# Patient Record
Sex: Female | Born: 1965 | Race: Black or African American | Hispanic: No | Marital: Married | Smoking: Never smoker
Health system: Southern US, Community
[De-identification: ages and names within clinical notes are randomized; demographics above are authoritative.]

## PROBLEM LIST (undated history)

## (undated) DIAGNOSIS — M1990 Unspecified osteoarthritis, unspecified site: Secondary | ICD-10-CM

## (undated) DIAGNOSIS — N289 Disorder of kidney and ureter, unspecified: Secondary | ICD-10-CM

## (undated) DIAGNOSIS — K219 Gastro-esophageal reflux disease without esophagitis: Secondary | ICD-10-CM

## (undated) DIAGNOSIS — E669 Obesity, unspecified: Secondary | ICD-10-CM

## (undated) DIAGNOSIS — I10 Essential (primary) hypertension: Secondary | ICD-10-CM

## (undated) DIAGNOSIS — L0292 Furuncle, unspecified: Secondary | ICD-10-CM

## (undated) DIAGNOSIS — M25569 Pain in unspecified knee: Secondary | ICD-10-CM

## (undated) DIAGNOSIS — M109 Gout, unspecified: Secondary | ICD-10-CM

## (undated) DIAGNOSIS — D219 Benign neoplasm of connective and other soft tissue, unspecified: Secondary | ICD-10-CM

## (undated) HISTORY — PX: TUBAL LIGATION: SHX77

## (undated) HISTORY — PX: CHOLECYSTECTOMY: SHX55

---

## 2004-05-24 ENCOUNTER — Emergency Department (HOSPITAL_COMMUNITY): Admission: EM | Admit: 2004-05-24 | Discharge: 2004-05-24 | Payer: Self-pay | Admitting: Emergency Medicine

## 2004-08-08 ENCOUNTER — Ambulatory Visit: Payer: Self-pay | Admitting: Internal Medicine

## 2004-08-09 ENCOUNTER — Encounter (INDEPENDENT_AMBULATORY_CARE_PROVIDER_SITE_OTHER): Payer: Self-pay | Admitting: Specialist

## 2004-08-09 ENCOUNTER — Ambulatory Visit: Payer: Self-pay | Admitting: Obstetrics and Gynecology

## 2004-08-14 ENCOUNTER — Ambulatory Visit (HOSPITAL_COMMUNITY): Admission: RE | Admit: 2004-08-14 | Discharge: 2004-08-14 | Payer: Self-pay | Admitting: *Deleted

## 2005-01-16 ENCOUNTER — Ambulatory Visit: Payer: Self-pay | Admitting: Internal Medicine

## 2005-01-16 ENCOUNTER — Ambulatory Visit (HOSPITAL_COMMUNITY): Admission: RE | Admit: 2005-01-16 | Discharge: 2005-01-16 | Payer: Self-pay | Admitting: Internal Medicine

## 2005-01-17 ENCOUNTER — Encounter (INDEPENDENT_AMBULATORY_CARE_PROVIDER_SITE_OTHER): Payer: Self-pay | Admitting: Internal Medicine

## 2005-01-17 ENCOUNTER — Ambulatory Visit: Payer: Self-pay | Admitting: Internal Medicine

## 2005-01-24 ENCOUNTER — Ambulatory Visit: Payer: Self-pay | Admitting: Internal Medicine

## 2005-06-25 ENCOUNTER — Ambulatory Visit: Payer: Self-pay | Admitting: Internal Medicine

## 2005-07-09 ENCOUNTER — Ambulatory Visit: Payer: Self-pay | Admitting: Hospitalist

## 2005-09-13 ENCOUNTER — Ambulatory Visit: Payer: Self-pay | Admitting: Internal Medicine

## 2005-10-09 ENCOUNTER — Emergency Department (HOSPITAL_COMMUNITY): Admission: EM | Admit: 2005-10-09 | Discharge: 2005-10-09 | Payer: Self-pay | Admitting: Emergency Medicine

## 2005-11-05 ENCOUNTER — Emergency Department (HOSPITAL_COMMUNITY): Admission: EM | Admit: 2005-11-05 | Discharge: 2005-11-05 | Payer: Self-pay | Admitting: Emergency Medicine

## 2006-06-26 ENCOUNTER — Emergency Department (HOSPITAL_COMMUNITY): Admission: EM | Admit: 2006-06-26 | Discharge: 2006-06-26 | Payer: Self-pay | Admitting: Emergency Medicine

## 2006-07-03 ENCOUNTER — Encounter (INDEPENDENT_AMBULATORY_CARE_PROVIDER_SITE_OTHER): Payer: Self-pay | Admitting: Internal Medicine

## 2006-07-03 DIAGNOSIS — K219 Gastro-esophageal reflux disease without esophagitis: Secondary | ICD-10-CM | POA: Insufficient documentation

## 2006-07-03 DIAGNOSIS — E669 Obesity, unspecified: Secondary | ICD-10-CM

## 2006-07-03 DIAGNOSIS — M069 Rheumatoid arthritis, unspecified: Secondary | ICD-10-CM | POA: Insufficient documentation

## 2006-07-04 DIAGNOSIS — M1990 Unspecified osteoarthritis, unspecified site: Secondary | ICD-10-CM | POA: Insufficient documentation

## 2006-09-01 ENCOUNTER — Telehealth: Payer: Self-pay | Admitting: *Deleted

## 2006-09-02 ENCOUNTER — Emergency Department (HOSPITAL_COMMUNITY): Admission: EM | Admit: 2006-09-02 | Discharge: 2006-09-02 | Payer: Self-pay | Admitting: Emergency Medicine

## 2006-12-04 ENCOUNTER — Emergency Department (HOSPITAL_COMMUNITY): Admission: EM | Admit: 2006-12-04 | Discharge: 2006-12-05 | Payer: Self-pay | Admitting: Emergency Medicine

## 2006-12-10 ENCOUNTER — Encounter (INDEPENDENT_AMBULATORY_CARE_PROVIDER_SITE_OTHER): Payer: Self-pay | Admitting: Internal Medicine

## 2006-12-10 ENCOUNTER — Ambulatory Visit: Payer: Self-pay | Admitting: Infectious Disease

## 2006-12-10 DIAGNOSIS — I10 Essential (primary) hypertension: Secondary | ICD-10-CM | POA: Insufficient documentation

## 2006-12-10 DIAGNOSIS — F17200 Nicotine dependence, unspecified, uncomplicated: Secondary | ICD-10-CM

## 2006-12-10 DIAGNOSIS — K589 Irritable bowel syndrome without diarrhea: Secondary | ICD-10-CM

## 2006-12-10 LAB — CONVERTED CEMR LAB
Basophils Absolute: 0 10*3/uL (ref 0.0–0.1)
Eosinophils Relative: 2 % (ref 0–5)
HCT: 39.7 % (ref 36.0–46.0)
Hgb A1c MFr Bld: 4.9 %
Lymphs Abs: 3.1 10*3/uL (ref 0.7–3.3)
Monocytes Absolute: 0.8 10*3/uL — ABNORMAL HIGH (ref 0.2–0.7)
RBC: 4.55 M/uL (ref 3.87–5.11)
RDW: 13.6 % (ref 11.5–14.0)
TSH: 2.038 microintl units/mL (ref 0.350–5.50)
WBC: 12.6 10*3/uL — ABNORMAL HIGH (ref 4.0–10.5)

## 2006-12-11 ENCOUNTER — Ambulatory Visit: Payer: Self-pay | Admitting: Infectious Disease

## 2006-12-11 ENCOUNTER — Encounter (INDEPENDENT_AMBULATORY_CARE_PROVIDER_SITE_OTHER): Payer: Self-pay | Admitting: Internal Medicine

## 2006-12-11 LAB — CONVERTED CEMR LAB
ALT: 14 units/L (ref 0–35)
AST: 12 units/L (ref 0–37)
Alkaline Phosphatase: 94 units/L (ref 39–117)
BUN: 10 mg/dL (ref 6–23)
CO2: 25 meq/L (ref 19–32)
Calcium: 9.1 mg/dL (ref 8.4–10.5)
Chloride: 103 meq/L (ref 96–112)
Glucose, Bld: 82 mg/dL (ref 70–99)
LDL Cholesterol: 87 mg/dL (ref 0–99)
Total Bilirubin: 0.5 mg/dL (ref 0.3–1.2)

## 2006-12-15 ENCOUNTER — Ambulatory Visit: Payer: Self-pay | Admitting: Internal Medicine

## 2006-12-25 ENCOUNTER — Emergency Department (HOSPITAL_COMMUNITY): Admission: EM | Admit: 2006-12-25 | Discharge: 2006-12-25 | Payer: Self-pay | Admitting: Emergency Medicine

## 2006-12-26 ENCOUNTER — Ambulatory Visit: Payer: Self-pay | Admitting: Internal Medicine

## 2007-02-09 ENCOUNTER — Emergency Department (HOSPITAL_COMMUNITY): Admission: EM | Admit: 2007-02-09 | Discharge: 2007-02-09 | Payer: Self-pay | Admitting: Emergency Medicine

## 2007-02-16 ENCOUNTER — Telehealth: Payer: Self-pay | Admitting: *Deleted

## 2007-02-19 ENCOUNTER — Ambulatory Visit (HOSPITAL_COMMUNITY): Admission: RE | Admit: 2007-02-19 | Discharge: 2007-02-19 | Payer: Self-pay | Admitting: Internal Medicine

## 2007-02-19 ENCOUNTER — Ambulatory Visit: Payer: Self-pay | Admitting: Internal Medicine

## 2007-03-23 ENCOUNTER — Encounter (INDEPENDENT_AMBULATORY_CARE_PROVIDER_SITE_OTHER): Payer: Self-pay | Admitting: *Deleted

## 2007-03-23 ENCOUNTER — Encounter (INDEPENDENT_AMBULATORY_CARE_PROVIDER_SITE_OTHER): Payer: Self-pay | Admitting: Internal Medicine

## 2007-03-23 ENCOUNTER — Ambulatory Visit: Payer: Self-pay | Admitting: *Deleted

## 2007-03-23 ENCOUNTER — Ambulatory Visit (HOSPITAL_COMMUNITY): Admission: RE | Admit: 2007-03-23 | Discharge: 2007-03-23 | Payer: Self-pay | Admitting: Internal Medicine

## 2007-03-23 ENCOUNTER — Ambulatory Visit: Payer: Self-pay | Admitting: Internal Medicine

## 2007-03-24 ENCOUNTER — Encounter (INDEPENDENT_AMBULATORY_CARE_PROVIDER_SITE_OTHER): Payer: Self-pay | Admitting: Internal Medicine

## 2007-04-07 ENCOUNTER — Telehealth: Payer: Self-pay | Admitting: *Deleted

## 2007-04-20 ENCOUNTER — Telehealth: Payer: Self-pay | Admitting: *Deleted

## 2007-04-29 ENCOUNTER — Telehealth (INDEPENDENT_AMBULATORY_CARE_PROVIDER_SITE_OTHER): Payer: Self-pay | Admitting: Internal Medicine

## 2007-05-15 ENCOUNTER — Telehealth: Payer: Self-pay | Admitting: *Deleted

## 2007-06-11 ENCOUNTER — Encounter (INDEPENDENT_AMBULATORY_CARE_PROVIDER_SITE_OTHER): Payer: Self-pay | Admitting: Internal Medicine

## 2007-06-11 ENCOUNTER — Ambulatory Visit: Payer: Self-pay | Admitting: Internal Medicine

## 2007-06-11 ENCOUNTER — Encounter: Payer: Self-pay | Admitting: Internal Medicine

## 2007-06-16 LAB — CONVERTED CEMR LAB
Anti Nuclear Antibody(ANA): NEGATIVE
BUN: 12 mg/dL (ref 6–23)
Calcium: 9.8 mg/dL (ref 8.4–10.5)
Rhuematoid fact SerPl-aCnc: <20 intl units/mL (ref 0–20)
Sed Rate: 15 mm/hr (ref 0–22)

## 2007-07-29 ENCOUNTER — Encounter (INDEPENDENT_AMBULATORY_CARE_PROVIDER_SITE_OTHER): Payer: Self-pay | Admitting: Internal Medicine

## 2007-07-29 ENCOUNTER — Ambulatory Visit (HOSPITAL_COMMUNITY): Admission: RE | Admit: 2007-07-29 | Discharge: 2007-07-29 | Payer: Self-pay | Admitting: Hospitalist

## 2007-07-29 ENCOUNTER — Ambulatory Visit: Payer: Self-pay | Admitting: Hospitalist

## 2007-07-29 LAB — CONVERTED CEMR LAB
ALT: 19 units/L (ref 0–35)
Albumin: 4.3 g/dL (ref 3.5–5.2)
Alkaline Phosphatase: 93 units/L (ref 39–117)
Basophils Absolute: 0 10*3/uL (ref 0.0–0.1)
Beta hcg, urine, semiquantitative: NEGATIVE
Blood in Urine, dipstick: NEGATIVE
CO2: 26 meq/L (ref 19–32)
Calcium: 9.7 mg/dL (ref 8.4–10.5)
Chloride: 104 meq/L (ref 96–112)
Eosinophils Absolute: 0.4 10*3/uL (ref 0.0–0.7)
Glucose, Bld: 99 mg/dL (ref 70–99)
Glucose, Urine, Semiquant: NEGATIVE
Hgb A1c MFr Bld: 5.1 %
Ketones, urine, test strip: NEGATIVE
Lipase: 15 units/L (ref 0–75)
Lymphocytes Relative: 28 % (ref 12–46)
Monocytes Absolute: 0.9 10*3/uL (ref 0.1–1.0)
Monocytes Relative: 8 % (ref 3–12)
Neutro Abs: 7.3 10*3/uL (ref 1.7–7.7)
Platelets: 300 10*3/uL (ref 150–400)
Potassium: 4 meq/L (ref 3.5–5.3)
Protein, U semiquant: 100
RDW: 13.6 % (ref 11.5–15.5)
Sodium: 141 meq/L (ref 135–145)
Total Bilirubin: 0.2 mg/dL — ABNORMAL LOW (ref 0.3–1.2)
WBC Urine, dipstick: NEGATIVE
WBC: 12 10*3/uL — ABNORMAL HIGH (ref 4.0–10.5)
pH: 5

## 2007-07-30 ENCOUNTER — Encounter (INDEPENDENT_AMBULATORY_CARE_PROVIDER_SITE_OTHER): Payer: Self-pay | Admitting: Internal Medicine

## 2007-10-13 ENCOUNTER — Encounter (INDEPENDENT_AMBULATORY_CARE_PROVIDER_SITE_OTHER): Payer: Self-pay | Admitting: Internal Medicine

## 2007-10-13 ENCOUNTER — Telehealth: Payer: Self-pay | Admitting: Internal Medicine

## 2007-10-13 ENCOUNTER — Encounter (INDEPENDENT_AMBULATORY_CARE_PROVIDER_SITE_OTHER): Payer: Self-pay | Admitting: *Deleted

## 2007-10-13 ENCOUNTER — Ambulatory Visit: Payer: Self-pay | Admitting: Internal Medicine

## 2007-10-13 DIAGNOSIS — N920 Excessive and frequent menstruation with regular cycle: Secondary | ICD-10-CM

## 2007-10-13 DIAGNOSIS — M549 Dorsalgia, unspecified: Secondary | ICD-10-CM | POA: Insufficient documentation

## 2007-10-13 LAB — CONVERTED CEMR LAB
Hemoglobin, Urine: NEGATIVE
Ketones, ur: NEGATIVE mg/dL
Leukocytes, UA: NEGATIVE
Protein, ur: NEGATIVE mg/dL
pH: 6 (ref 5.0–8.0)

## 2007-10-20 ENCOUNTER — Encounter: Payer: Self-pay | Admitting: Internal Medicine

## 2007-10-20 ENCOUNTER — Ambulatory Visit: Payer: Self-pay | Admitting: Internal Medicine

## 2007-10-20 LAB — CONVERTED CEMR LAB
ALT: 14 units/L (ref 0–35)
AST: 12 units/L (ref 0–37)
Albumin: 4 g/dL (ref 3.5–5.2)
Basophils Relative: 0 % (ref 0–1)
Chloride: 101 meq/L (ref 96–112)
Creatinine, Ser: 0.62 mg/dL (ref 0.40–1.20)
Cyclic Citrullin Peptide Ab: 1.3 units (ref <7)
Eosinophils Absolute: 0.3 10*3/uL (ref 0.0–0.7)
Glucose, Bld: 85 mg/dL (ref 70–99)
HCT: 38.6 % (ref 36.0–46.0)
Lymphocytes Relative: 28 % (ref 12–46)
Lymphs Abs: 2.4 10*3/uL (ref 0.7–4.0)
MCV: 91.9 fL (ref 78.0–100.0)
Monocytes Absolute: 0.5 10*3/uL (ref 0.1–1.0)
Neutro Abs: 5.3 10*3/uL (ref 1.7–7.7)
Neutrophils Relative %: 62 % (ref 43–77)
Platelets: 285 10*3/uL (ref 150–400)
Potassium: 4.4 meq/L (ref 3.5–5.3)
RBC: 4.2 M/uL (ref 3.87–5.11)
RDW: 14.4 % (ref 11.5–15.5)
Sodium: 136 meq/L (ref 135–145)
Total Bilirubin: 0.4 mg/dL (ref 0.3–1.2)
Total Protein: 7.1 g/dL (ref 6.0–8.3)
WBC: 8.5 10*3/uL (ref 4.0–10.5)

## 2007-11-13 ENCOUNTER — Ambulatory Visit: Payer: Self-pay | Admitting: *Deleted

## 2007-11-13 ENCOUNTER — Encounter (INDEPENDENT_AMBULATORY_CARE_PROVIDER_SITE_OTHER): Payer: Self-pay | Admitting: Internal Medicine

## 2007-11-16 LAB — CONVERTED CEMR LAB
Amphetamine Screen, Ur: NEGATIVE
BUN: 12 mg/dL (ref 6–23)
Benzodiazepines.: NEGATIVE
CO2: 25 meq/L (ref 19–32)
Calcium: 9.4 mg/dL (ref 8.4–10.5)
Chloride: 100 meq/L (ref 96–112)
Cocaine Metabolites: NEGATIVE
Creatinine,U: 106.9 mg/dL
Glucose, Bld: 87 mg/dL (ref 70–99)
Marijuana Metabolite: NEGATIVE
Opiates: NEGATIVE
Sodium: 138 meq/L (ref 135–145)

## 2008-02-12 ENCOUNTER — Telehealth (INDEPENDENT_AMBULATORY_CARE_PROVIDER_SITE_OTHER): Payer: Self-pay | Admitting: Internal Medicine

## 2008-05-09 ENCOUNTER — Encounter (INDEPENDENT_AMBULATORY_CARE_PROVIDER_SITE_OTHER): Payer: Self-pay | Admitting: Internal Medicine

## 2008-05-09 ENCOUNTER — Ambulatory Visit: Payer: Self-pay | Admitting: Internal Medicine

## 2008-05-09 ENCOUNTER — Encounter: Payer: Self-pay | Admitting: Internal Medicine

## 2008-05-09 LAB — CONVERTED CEMR LAB
ALT: 14 units/L (ref 0–35)
Albumin: 3.9 g/dL (ref 3.5–5.2)
Chloride: 100 meq/L (ref 96–112)
Creatinine, Ser: 0.69 mg/dL (ref 0.40–1.20)
Sodium: 137 meq/L (ref 135–145)
Total Bilirubin: 0.3 mg/dL (ref 0.3–1.2)

## 2008-06-14 ENCOUNTER — Ambulatory Visit: Payer: Self-pay | Admitting: Internal Medicine

## 2008-06-14 ENCOUNTER — Encounter (INDEPENDENT_AMBULATORY_CARE_PROVIDER_SITE_OTHER): Payer: Self-pay | Admitting: Internal Medicine

## 2008-06-14 LAB — CONVERTED CEMR LAB
Basophils Relative: 0 % (ref 0–1)
CO2: 25 meq/L (ref 19–32)
Creatinine, Ser: 0.66 mg/dL (ref 0.40–1.20)
Glucose, Bld: 83 mg/dL (ref 70–99)
Ketones, ur: NEGATIVE mg/dL
Lipase: 17 units/L (ref 0–75)
Lymphocytes Relative: 27 % (ref 12–46)
MCHC: 32 g/dL (ref 30.0–36.0)
MCV: 89.5 fL (ref 78.0–100.0)
Neutrophils Relative %: 65 % (ref 43–77)
Platelets: 311 10*3/uL (ref 150–400)
Potassium: 3.8 meq/L (ref 3.5–5.3)
Protein, ur: NEGATIVE mg/dL
RDW: 14.2 % (ref 11.5–15.5)
Sed Rate: 20 mm/hr (ref 0–22)
Total Protein: 7.4 g/dL (ref 6.0–8.3)
Urine Glucose: NEGATIVE mg/dL
Urobilinogen, UA: 0.2 (ref 0.0–1.0)
WBC: 11.1 10*3/uL — ABNORMAL HIGH (ref 4.0–10.5)
pH: 6.5 (ref 5.0–8.0)

## 2008-06-15 ENCOUNTER — Ambulatory Visit (HOSPITAL_COMMUNITY): Admission: RE | Admit: 2008-06-15 | Discharge: 2008-06-15 | Payer: Self-pay | Admitting: Internal Medicine

## 2008-06-15 ENCOUNTER — Ambulatory Visit: Payer: Self-pay | Admitting: Internal Medicine

## 2008-06-15 ENCOUNTER — Encounter (INDEPENDENT_AMBULATORY_CARE_PROVIDER_SITE_OTHER): Payer: Self-pay | Admitting: Internal Medicine

## 2008-06-15 DIAGNOSIS — K7689 Other specified diseases of liver: Secondary | ICD-10-CM

## 2008-07-04 ENCOUNTER — Telehealth: Payer: Self-pay | Admitting: Internal Medicine

## 2008-07-14 ENCOUNTER — Telehealth (INDEPENDENT_AMBULATORY_CARE_PROVIDER_SITE_OTHER): Payer: Self-pay | Admitting: Internal Medicine

## 2008-07-28 ENCOUNTER — Telehealth (INDEPENDENT_AMBULATORY_CARE_PROVIDER_SITE_OTHER): Payer: Self-pay | Admitting: *Deleted

## 2008-08-08 ENCOUNTER — Ambulatory Visit: Payer: Self-pay | Admitting: Internal Medicine

## 2008-08-08 ENCOUNTER — Encounter (INDEPENDENT_AMBULATORY_CARE_PROVIDER_SITE_OTHER): Payer: Self-pay | Admitting: Internal Medicine

## 2008-08-27 ENCOUNTER — Emergency Department (HOSPITAL_COMMUNITY): Admission: EM | Admit: 2008-08-27 | Discharge: 2008-08-27 | Payer: Self-pay | Admitting: Emergency Medicine

## 2008-12-09 ENCOUNTER — Ambulatory Visit: Payer: Self-pay | Admitting: Internal Medicine

## 2008-12-09 ENCOUNTER — Ambulatory Visit (HOSPITAL_COMMUNITY): Admission: RE | Admit: 2008-12-09 | Discharge: 2008-12-09 | Payer: Self-pay | Admitting: Internal Medicine

## 2008-12-09 DIAGNOSIS — N946 Dysmenorrhea, unspecified: Secondary | ICD-10-CM | POA: Insufficient documentation

## 2008-12-09 LAB — CONVERTED CEMR LAB
CO2: 28 meq/L (ref 19–32)
Chloride: 101 meq/L (ref 96–112)
Potassium: 3.8 meq/L (ref 3.5–5.3)

## 2008-12-27 ENCOUNTER — Telehealth: Payer: Self-pay | Admitting: Internal Medicine

## 2009-03-29 ENCOUNTER — Telehealth (INDEPENDENT_AMBULATORY_CARE_PROVIDER_SITE_OTHER): Payer: Self-pay | Admitting: *Deleted

## 2009-03-31 ENCOUNTER — Emergency Department (HOSPITAL_COMMUNITY): Admission: EM | Admit: 2009-03-31 | Discharge: 2009-04-01 | Payer: Self-pay | Admitting: Emergency Medicine

## 2009-05-01 ENCOUNTER — Telehealth: Payer: Self-pay | Admitting: *Deleted

## 2009-05-06 IMAGING — CR DG ABDOMEN 2V
5 series · 5 of 5 positions shown · non-contrast
Comparison: None

CLINICAL DATA: Right lower quadrant pain for 2 weeks

ABDOMEN - 2 VIEW

[w abdomen upright * (1 of 2)]
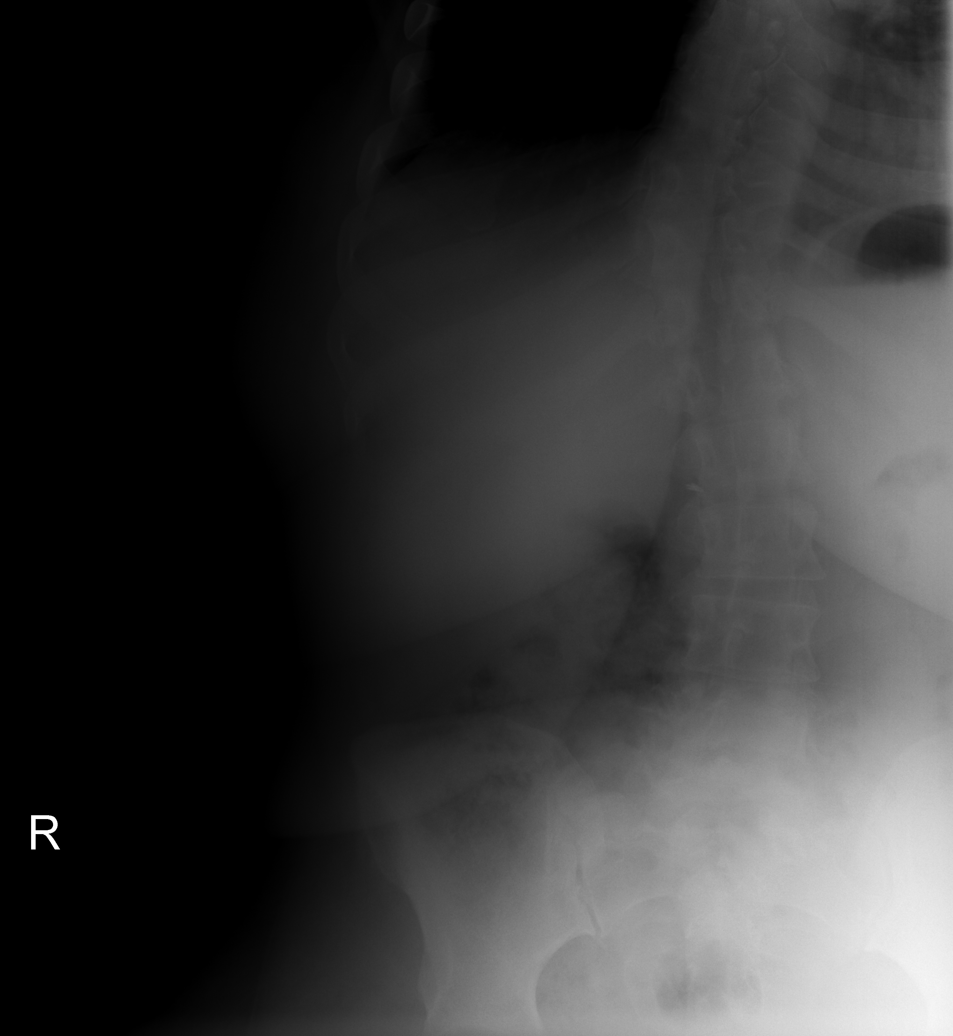

[w abdomen upright * (2 of 2)]
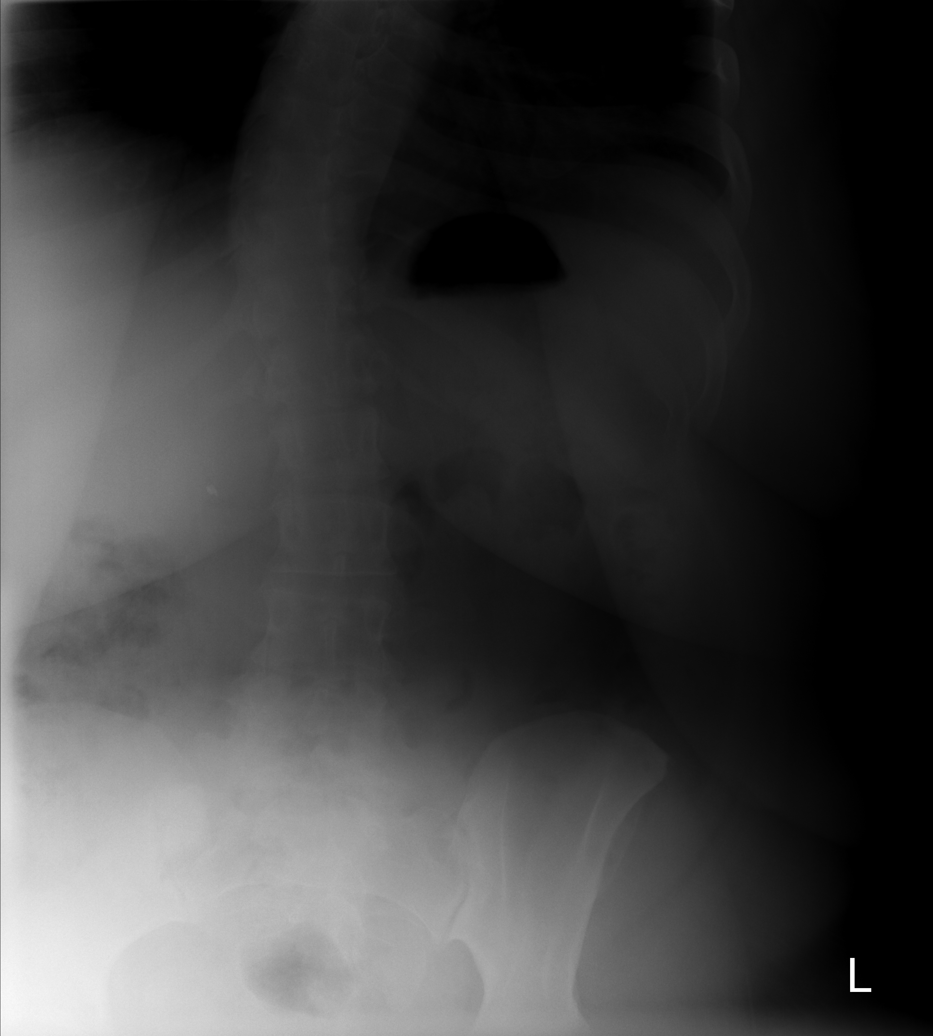

[t abdomen supine * (1 of 3)]
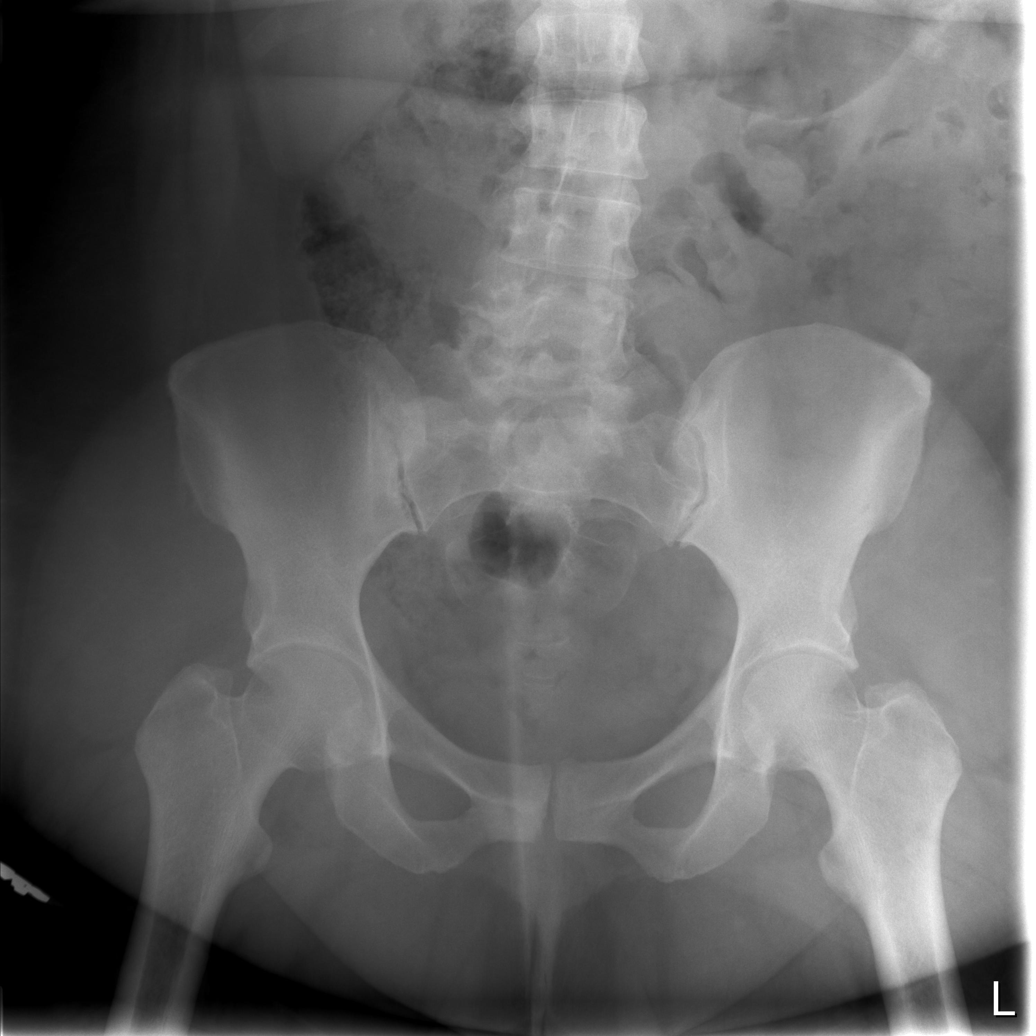

[t abdomen supine * (2 of 3)]
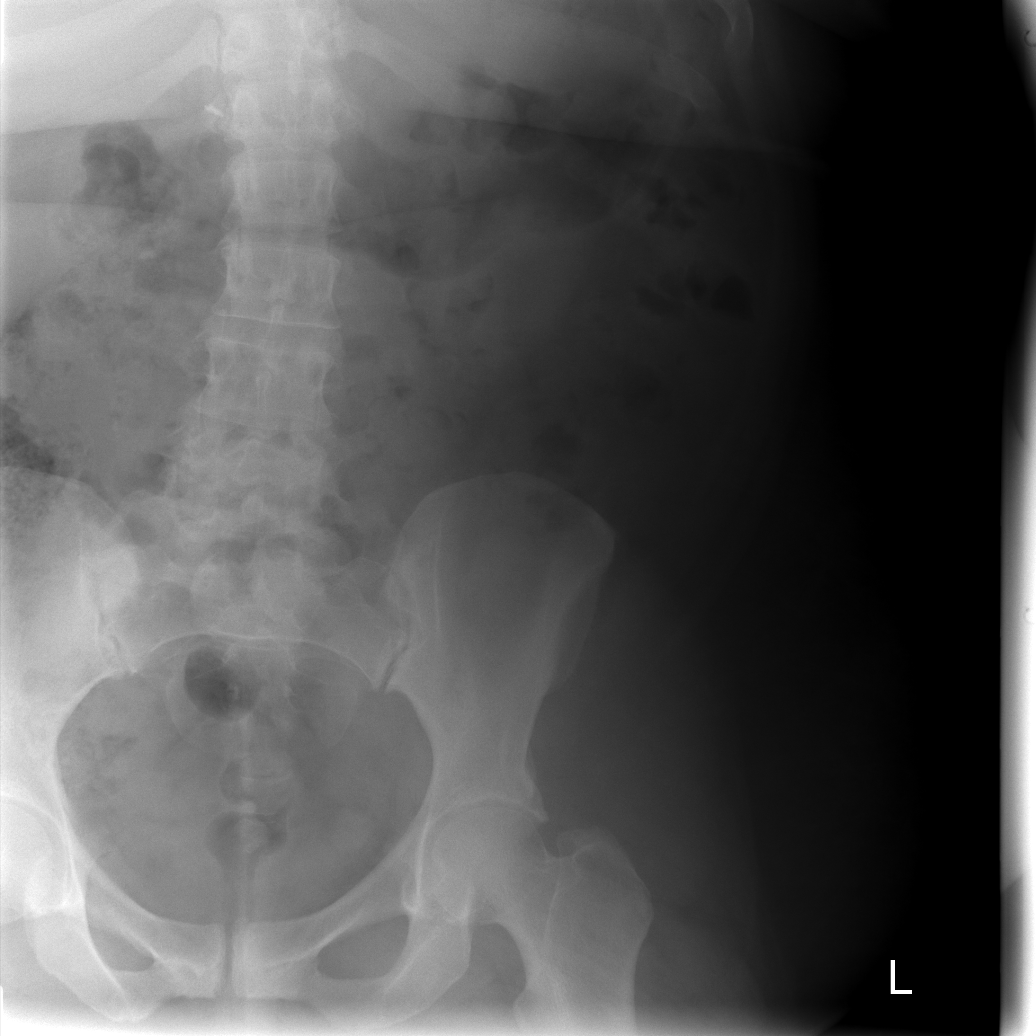

[t abdomen supine * (3 of 3)]
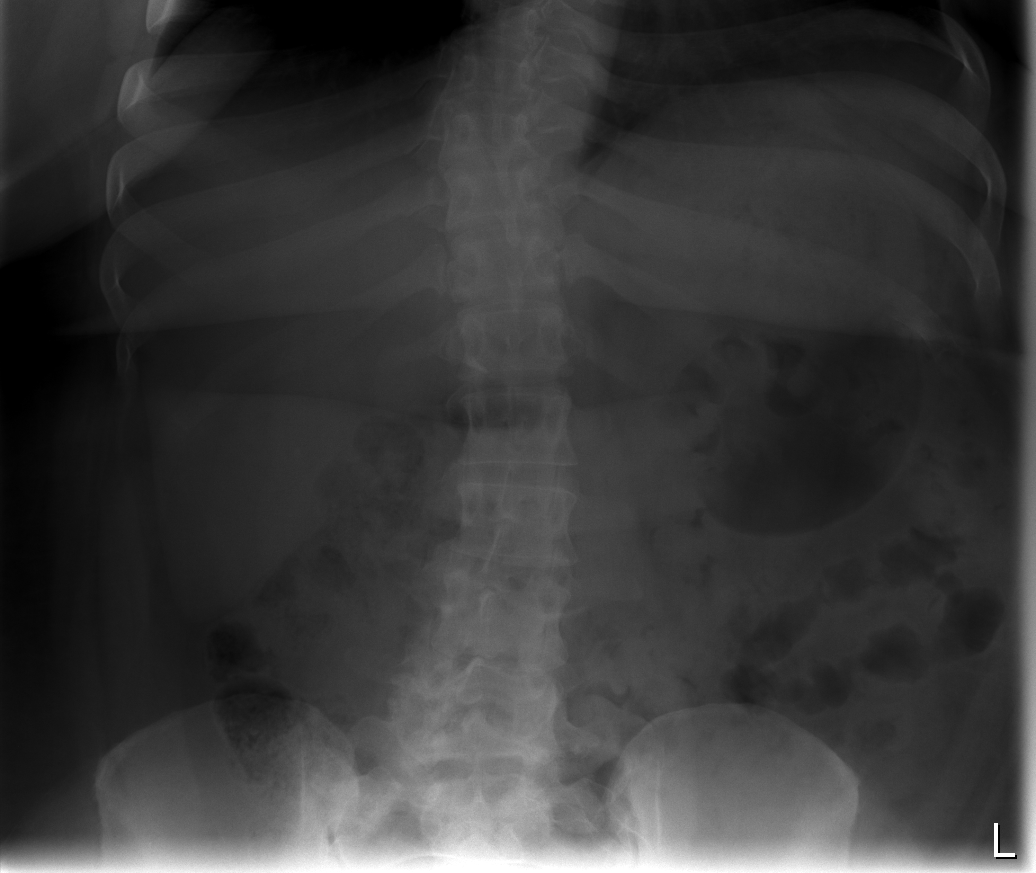

[5 of 5 positions shown; findings below may reference images not displayed]

FINDINGS: No bowel obstruction is seen and no free air is noted.
No opaque calculi are seen.
IMPRESSION: Nonspecific bowel gas pattern.  Probable degenerative change
involving the SI joints right greater than left.

## 2009-06-05 ENCOUNTER — Encounter (INDEPENDENT_AMBULATORY_CARE_PROVIDER_SITE_OTHER): Payer: Self-pay | Admitting: Internal Medicine

## 2009-06-05 ENCOUNTER — Telehealth: Payer: Self-pay | Admitting: *Deleted

## 2009-06-05 ENCOUNTER — Ambulatory Visit: Payer: Self-pay | Admitting: Internal Medicine

## 2009-06-05 DIAGNOSIS — M25561 Pain in right knee: Secondary | ICD-10-CM

## 2009-06-05 DIAGNOSIS — M25562 Pain in left knee: Secondary | ICD-10-CM

## 2009-06-05 LAB — CONVERTED CEMR LAB
BUN: 14 mg/dL (ref 6–23)
Chloride: 99 meq/L (ref 96–112)
Creatinine, Ser: 0.69 mg/dL (ref 0.40–1.20)
Glucose, Bld: 76 mg/dL (ref 70–99)
Potassium: 4.3 meq/L (ref 3.5–5.3)

## 2009-06-12 ENCOUNTER — Telehealth: Payer: Self-pay | Admitting: Internal Medicine

## 2009-07-03 ENCOUNTER — Encounter: Admission: RE | Admit: 2009-07-03 | Discharge: 2009-08-07 | Payer: Self-pay | Admitting: Internal Medicine

## 2009-07-19 ENCOUNTER — Ambulatory Visit: Payer: Self-pay | Admitting: Internal Medicine

## 2009-07-19 ENCOUNTER — Encounter (INDEPENDENT_AMBULATORY_CARE_PROVIDER_SITE_OTHER): Payer: Self-pay | Admitting: Internal Medicine

## 2009-07-28 ENCOUNTER — Telehealth: Payer: Self-pay | Admitting: Internal Medicine

## 2009-08-16 ENCOUNTER — Encounter: Payer: Self-pay | Admitting: Family Medicine

## 2009-08-16 ENCOUNTER — Ambulatory Visit: Payer: Self-pay | Admitting: Sports Medicine

## 2009-08-16 DIAGNOSIS — R269 Unspecified abnormalities of gait and mobility: Secondary | ICD-10-CM | POA: Insufficient documentation

## 2009-11-06 ENCOUNTER — Telehealth: Payer: Self-pay | Admitting: *Deleted

## 2009-11-06 ENCOUNTER — Telehealth: Payer: Self-pay | Admitting: Internal Medicine

## 2010-01-23 ENCOUNTER — Telehealth: Payer: Self-pay | Admitting: *Deleted

## 2010-03-30 ENCOUNTER — Ambulatory Visit: Payer: Self-pay | Admitting: Internal Medicine

## 2010-04-16 ENCOUNTER — Encounter: Admission: RE | Admit: 2010-04-16 | Payer: Self-pay | Source: Home / Self Care | Admitting: Internal Medicine

## 2010-04-18 ENCOUNTER — Ambulatory Visit: Payer: Self-pay | Admitting: Sports Medicine

## 2010-05-03 ENCOUNTER — Ambulatory Visit: Admit: 2010-05-03 | Payer: Self-pay

## 2010-05-09 ENCOUNTER — Ambulatory Visit: Admit: 2010-05-09 | Payer: Self-pay | Admitting: Sports Medicine

## 2010-05-09 ENCOUNTER — Ambulatory Visit: Admission: RE | Admit: 2010-05-09 | Discharge: 2010-05-09 | Payer: Self-pay | Source: Home / Self Care

## 2010-05-14 ENCOUNTER — Ambulatory Visit
Admission: RE | Admit: 2010-05-14 | Discharge: 2010-05-14 | Payer: Self-pay | Source: Home / Self Care | Attending: Family Medicine | Admitting: Family Medicine

## 2010-05-21 ENCOUNTER — Ambulatory Visit: Admit: 2010-05-21 | Payer: Self-pay | Admitting: Family Medicine

## 2010-05-23 ENCOUNTER — Encounter: Admission: RE | Admit: 2010-05-23 | Payer: Self-pay | Source: Home / Self Care | Admitting: Internal Medicine

## 2010-05-29 NOTE — Progress Notes (Signed)
  Phone Note Call from Patient   Summary of Call: Patient called that she has been having dry cough, myalgias, sore throat for the last couple of days. Denies chest pain, shortness of breath, palpitations, diaphoresis.  Patient was instructed to treat symptomatically with over the counter cough supressants as it is most likely viral upper respiratory tract infection but if symptoms worsened to go to the Emergency Room or Urgent care center for further evaluation. Patient was agreable to go to the ED or urgent care center if needed.

## 2010-05-29 NOTE — Assessment & Plan Note (Signed)
Summary: NP,DJD RT KNEE,PROBABLY NEEDS INJECTIONS,MC   Primary Provider:  Marinda Elk MD   History of Present Illness: Longstanding history of medial right knee pain of insidious onset  ~4 months ago. Pain worst on prolonged ambulation and deep flexion. Pain relived on rest and position change. No history of right knee injury or procedures. No knee popping/locking/catching. Occasoinally right knee buckling sensation which occurs w/pain. No falls. No back/hip/ankle/foot pain. No paresthesias.  Allergies: 1)  ! Iodine  Past History:  Currently unemployed PMH-FH-SH reviewed for relevance  Physical Exam  General:  Well-developed,well-nourished,in no acute distress; alert,appropriate and cooperative throughout examination. Obese. Msk:  HIPS: * Limitation partly attributable to habitus. Mild general limitation wrt ROM given habitus. Some right abduction weakness (4/5).  KNEES: * Limitation partly attributable to habitus. Mod genu valgum. Mild ttp over right pes anserine bursa. No ligamentous instability. Patient guarding on McMurray's. Normal nv examination.  AKLES/FEET/GAIT: Pes planus. Excessive pronation. FROM. Full strength. Findings partly controlled on comforthotic insertion.    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 Component of pes bursitis 2/2 abnl gait resulting from knee pain and ankle/foot deformity. Meniscal exam limited by patient guarding.  - D/C aleve. - Start mobic. - [employer] rx for right pat stabilizer brace for use only during ambulatory activities. - Comforthotics for ankle/foot deformities which place medial stress on knee during ambulation. - Start formal physical therapy. - RTC in 4-6 weeks.  Her updated medication list for this problem includes:    Tramadol Hcl 50 Mg Tabs (Tramadol hcl) .Marland Kitchen... Take 1-2 tabs every 4 hours as needed for pain.    Vicodin 5-500 Mg Tabs  (Hydrocodone-acetaminophen) .Marland Kitchen... Take 1 tab by mouth every 4 hrs as needed for pain.  Orders: Home Health Referral (Home Health)  Problem # 2:  OTHER ACQUIRED DEFORMITY OF ANKLE AND FOOT OTHER (ICD-736.79)  - Per item #1.  Orders: Home Health Referral (Home Health)  Problem # 3:  ABNORMALITY OF GAIT (ICD-781.2)  - Per item #2.  Complete Medication List: 1)  Zestoretic 20-25 Mg Tabs (Lisinopril-hydrochlorothiazide) .... Take 1 tablet by mouth daily. 2)  Dicyclomine Hcl 20 Mg Tabs (Dicyclomine hcl) .... Take 1 tablet by mouth four times a day for seven days 3)  Tramadol Hcl 50 Mg Tabs (Tramadol hcl) .... Take 1-2 tabs every 4 hours as needed for pain. 4)  Vicodin 5-500 Mg Tabs (Hydrocodone-acetaminophen) .... Take 1 tab by mouth every 4 hrs as needed for pain.

## 2010-05-29 NOTE — Progress Notes (Signed)
Summary: Refill/gh  Phone Note Refill Request Message from:  Fax from Pharmacy on May 01, 2009 4:39 PM  Refills Requested: Medication #1:  ZESTORETIC 20-25 MG TABS take 1 tablet by mouth daily.   Last Refilled: 11/26/2008  Method Requested: Electronic Initial call taken by: Angelina Ok RN,  May 01, 2009 4:39 PM  Follow-up for Phone Call        Refill approved-nurse to complete Follow-up by: Julaine Fusi  DO,  May 02, 2009 9:59 AM    Prescriptions: ZESTORETIC 20-25 MG TABS (LISINOPRIL-HYDROCHLOROTHIAZIDE) take 1 tablet by mouth daily.  #31 x 11   Entered and Authorized by:   Julaine Fusi  DO   Signed by:   Julaine Fusi  DO on 05/02/2009   Method used:   Electronically to        [facility] [phone]* (retail)       [address]       [place], [place]  [address]       Ph: [phone]       Fax: [phone]   RxID:   [id number]

## 2010-05-29 NOTE — Miscellaneous (Signed)
Summary: Audio-Visual Observation & Taping  Audio-Visual Observation & Taping   Imported By: Florinda Marker 06/09/2009 15:44:31  _____________________________________________________________________  External Attachment:    Type:   Image     Comment:   External Document

## 2010-05-29 NOTE — Letter (Signed)
Summary: [facility] re[facility] referral form  [facility] referral form   Imported By: Marily Memos 08/21/2009 11:16:05  _____________________________________________________________________  External Attachment:    Type:   Image     Comment:   External Document

## 2010-05-29 NOTE — Progress Notes (Signed)
Summary: phone/gg  Phone Note Call from Patient   Caller: Patient Summary of Call: Pt called with c/o knee swelling and pain.  She can't afford patch and cream that was ordered.  She request pain med. She is unable to sleep because of the pain.  Pt seen in clinic on 2/7 Pt # [phone] [facility] called and they do not carry voltaren gel or flector patch.  Pt request more pain med as DICLOFENAC is not helping.  Pt very angry when I talked to her about goint to [facility] to get her meds.  She said she would go to ED if I didn't want to help her.  Pt then hung up the phone.   Initial call taken by: Merrie Roof RN,  June 12, 2009 10:29 AM  Follow-up for Phone Call        Aware of phone-call. Will call in Tramadol for patient.   Additional Follow-up for Phone Call Additional follow up Details #1::        Pt called back and shse went to [facility] and they don't carry the patches or gel.  What is she to do? She wants pain meds called into [facility] on [address]. Additional Follow-up by: Merrie Roof RN,  June 12, 2009 2:04 PM    New/Updated Medications: TRAMADOL HCL 50 MG TABS (TRAMADOL HCL) Take 1-2 tabs every 4 hours as needed for pain. Prescriptions: TRAMADOL HCL 50 MG TABS (TRAMADOL HCL) Take 1-2 tabs every 4 hours as needed for pain.  #120 x 2   Entered and Authorized by:   Melida Quitter MD   Signed by:   Melida Quitter MD on 06/12/2009   Method used:   Telephoned to ...       [facility] [address] [phone]* (retail)       [address]       [place], [place]  [address]       Ph: [phone]       Fax: [phone]   RxID:   [id number]   Appended Document: phone/gg called into [facility]

## 2010-05-29 NOTE — Assessment & Plan Note (Signed)
Summary: CHECKUP/SB.   Vital Signs:  Patient profile:   45 year old female Height:      61 inches Weight:      340.1 pounds BMI:     64.49 Temp:     98.5 degrees F oral Pulse rate:   96 / minute BP sitting:   100 / 60  (left arm)  Vitals Entered By: Stanton Kidney Ditzler RN (July 19, 2009 3:18 PM) Is Patient Diabetic? No Pain Assessment Patient in pain? yes     Location: right knee Intensity: 8.5 Type: aching Onset of pain  PT makes knee worse Nutritional Status BMI of 25 - 29 = overweight Nutritional Status Detail appetite so-so  Have you ever been in a relationship where you felt threatened, hurt or afraid?denies   Does patient need assistance? Functional Status Self care Ambulation Normal Comments Ck  right knee - PT makes it worse. Falling alot. Refills on meds.   Primary Care Provider:  Marinda Elk MD   History of Present Illness: Pt is a 45 yo F with seronegative RA, HTN, and bipolar disorder who is here for f/u for her right knee pain.  She has been having the pain constantly for 6 months.  It is described as a dull pain.  It is an 8/10 on the pain scale.  She has had several incidences in which she fell from her knee giving out.  She has been going to PT and thinks she gets some benefit from some aspects of it. Her knee pain is worse when she is in any one position for a long time in regards to her knee.  She has been taking tramadol which she says does not really help but just makes her feel sleepy.   She had X-rays of her knees done as recently as December which showed no real acute findings.  She denies having any fevers or chills.  Her knee has never been tapped.  There was no h/o trauma to that knee at the onset.  Depression History:      The patient denies a depressed mood most of the day and a diminished interest in her usual daily activities.         Preventive Screening-Counseling & Management  Alcohol-Tobacco     Alcohol drinks/day: 0     Alcohol  type: BEER / WINE ABOUT EVERY DAY     Smoking Status: quit     Smoking Cessation Counseling: yes     Year Started: 2002/ 2-3 CIGS A DAY     Year Quit: 8/08     Passive Smoke Exposure: no  Caffeine-Diet-Exercise     Does Patient Exercise: no  Current Medications (verified): 1)  Zestoretic 20-25 Mg Tabs (Lisinopril-Hydrochlorothiazide) .... Take 1 Tablet By Mouth Daily. 2)  Dicyclomine Hcl 20 Mg Tabs (Dicyclomine Hcl) .... Take 1 Tablet By Mouth Four Times A Day For Seven Days 3)  Tramadol Hcl 50 Mg Tabs (Tramadol Hcl) .... Take 1-2 Tabs Every 4 Hours As Needed For Pain.  Allergies: 1)  ! Iodine  Past History:  Past Medical History: obesity L ear otitis with perforation GERD Hypertension Rheumatoid arthritis- Rf neg? 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 Tob- quit smoking (used to smoke as a job for [employer]) Alcohol- occasionally Crack- remote history (>15 yrs) [facility] has  2 children who do not live with her.  Review of Systems       The patient complains of weight loss, abdominal pain, difficulty walking, and depression.  The patient denies anorexia, fever, weight gain, vision loss, decreased hearing, chest pain, syncope, dyspnea on exertion, peripheral edema, prolonged cough, headaches, melena, and hematochezia.    Physical Exam  General:  alert, well-developed, and well-nourished.  obese Eyes:  vision grossly intact.   Mouth:  no gingival abnormalities and pharynx pink and moist.   Lungs:  normal respiratory effort, normal breath sounds, no crackles, and no wheezes.   Heart:  normal rate, regular rhythm, no murmur, no gallop, and no rub.   Abdomen:  soft, non-tender, normal bowel sounds, no distention, no masses, no guarding, and no rigidity. multiple striae  Msk:  right knee has maybe a small  effusion, joint is warm to the touch, increased laxity especially with anterior displacement of the tibia on the femur, no real point tenderness Extremities:  no edema Neurologic:  strength 5/5 throughout, sensation intact in all extremities   Impression & Recommendations:  Problem # 1:  KNEE PAIN, RIGHT (ICD-719.46) I think this is most likely an osteoarthritis picture secondary to the severe strain on her knee caused by her morbid obesity.  She does have real evidence of inflammation at hat joint.  The diclofenac did not help her.  I would like her to see a sports medicine doctor if they feel like they can help her.  I will call their offices tomorrow and if appropriate will make the referral.  I will, in the mean time, give her a short course of Vicodin to replace her Tramadol to see if she tolerates that better.  I don't necessarily want this to be a long term solution, but simply a bridge to effective treatment with PT and/or Sports Medicine. Dr. Darrick Penna agreed to see this patient. The following medications were removed from the medication list:    Diclofenac Sodium 75 Mg Tbec (Diclofenac sodium) .Marland Kitchen..Marland Kitchen Two times a day Her updated medication list for this problem includes:    Tramadol Hcl 50 Mg Tabs (Tramadol hcl) .Marland Kitchen... Take 1-2 tabs every 4 hours as needed for pain.    Vicodin 5-500 Mg Tabs (Hydrocodone-acetaminophen) .Marland Kitchen... Take 1 tab by mouth every 4 hrs as needed for pain.  Problem # 2:  HYPERTENSION, ESSENTIAL NOS (ICD-401.9) Pt has been taking her meds and her BP is at goal.  SBP 100, but she has not been experiencing dizziness. Her updated medication list for this problem includes:    Zestoretic 20-25 Mg Tabs (Lisinopril-hydrochlorothiazide) .Marland Kitchen... Take 1 tablet by mouth daily.  Problem # 3:  OBESITY (ICD-278.00) I have told her that this is likely the underlying cause of her severe knee pain.  She understands and was excited about her recent 5 lb weight loss.  This will undoubtedly be  a long term problem for her, but I have encouraged her to make some changes in order to attempt to continue to lose weight.  Complete Medication List: 1)  Zestoretic 20-25 Mg Tabs (Lisinopril-hydrochlorothiazide) .... Take 1 tablet by mouth daily. 2)  Dicyclomine Hcl 20 Mg Tabs (Dicyclomine hcl) .... Take 1 tablet by mouth four times a day for seven days 3)  Tramadol Hcl 50 Mg Tabs (Tramadol hcl) .... Take 1-2 tabs every 4 hours as needed for pain. 4)  Vicodin 5-500 Mg Tabs (Hydrocodone-acetaminophen) .... Take 1 tab by mouth every 4 hrs as needed for  pain.  Patient Instructions: 1)  Please schedule a follow-up appointment in 2 months. 2)  You will start taking Vicodin 1 tab as needed every 4 hrs as needed for pain. 3)  I will call the people at sports medicine and get you an appointment to see them. 4)  You need to lose weight. Consider a lower calorie diet and regular exercise.  Prescriptions: VICODIN 5-500 MG TABS (HYDROCODONE-ACETAMINOPHEN) Take 1 tab by mouth every 4 hrs as needed for pain.  #50 x 0   Entered and Authorized by:   Brooks Sailors MD   Signed by:   Brooks Sailors MD on 07/19/2009   Method used:   Print then Give to Patient   RxID:   [id number]    Prevention & Chronic Care Immunizations   Influenza vaccine: refuses  (03/23/2007)   Influenza vaccine deferral: Refused  (06/05/2009)    Tetanus booster: Not documented   Td booster deferral: Deferred  (12/09/2008)    Pneumococcal vaccine: Not documented  Other Screening   Pap smear: Not documented   Pap smear action/deferral: Refused  (06/05/2009)    Mammogram: Not documented   Mammogram action/deferral: Deferred to age 89  (12/09/2008)   Smoking status: quit  (07/19/2009)  Lipids   Total Cholesterol: 153  (12/11/2006)   LDL: 87  (12/11/2006)   LDL Direct: Not documented   HDL: 48  (12/11/2006)   Triglycerides: 88  (12/11/2006)  Hypertension   Last Blood Pressure: 100 / 60  (07/19/2009)   Serum  creatinine: 0.69  (06/05/2009)   Serum potassium 4.3  (06/05/2009)  Self-Management Support :   Personal Goals (by the next clinic visit) :      Personal blood pressure goal: 140/90  (06/05/2009)   Patient will work on the following items until the next clinic visit to reach self-care goals:     Medications and monitoring: take my medicines every day, bring all of my medications to every visit  (07/19/2009)     Eating: eat more vegetables, use fresh or frozen vegetables, eat baked foods instead of fried foods, eat fruit for snacks and desserts  (07/19/2009)     Activity: park at the far end of the parking lot  (07/19/2009)    Hypertension self-management support: Written self-care plan, Education handout, Resources for patients handout  (07/19/2009)   Hypertension self-care plan printed.   Hypertension education handout printed      Resource handout printed.

## 2010-05-29 NOTE — Progress Notes (Signed)
Summary: phone/gg  Phone Note Call from Patient   Caller: Patient Summary of Call: Pt c/o left knee pain and swelling x 1 week.  No known injury  Hx of same pain and was seen at sports med in past. We are unable to see pt today..............[facility] Initial call taken by: Merrie Roof RN,  January 23, 2010 11:02 AM

## 2010-05-29 NOTE — Progress Notes (Signed)
Summary: appt/ hla  Phone Note Call from Patient   Summary of Call: pt calls and c/o abd pain, cramping and diarrhea, states she cannot get transp until 7/13, appt is made. she states she is able to eat, drink denies all other problems. nothing alleviates, nothing makes worse. she is instructed to go to ED or call 911 if needed, she verb understanding Initial call taken by: Marin Roberts RN,  November 07, 2009 6:05 PM

## 2010-05-29 NOTE — Progress Notes (Signed)
Summary: refill/ hla  Phone Note Refill Request Message from:  Patient on November 06, 2009 3:18 PM  Refills Requested: Medication #1:  DICYCLOMINE HCL 20 MG TABS Take 1 tablet by mouth four times a day for seven days   Dosage confirmed as above?Dosage Confirmed Initial call taken by: Marin Roberts RN,  November 06, 2009 3:18 PM  Follow-up for Phone Call        Rxsubmitted to pharmacy Follow-up by: Nelda Bucks DO,  November 07, 2009 8:13 AM    Prescriptions: DICYCLOMINE HCL 20 MG TABS (DICYCLOMINE HCL) Take 1 tablet by mouth four times a day for seven days  #28 x 6   Entered and Authorized by:   Nelda Bucks DO   Signed by:   Nelda Bucks DO on 11/07/2009   Method used:   Electronically to        [facility] [phone]* (retail)       [address]       [place], [place]  [address]       Ph: [phone]       Fax: [phone]   RxID:   [id number]

## 2010-05-29 NOTE — Miscellaneous (Signed)
Summary: Initial Summary For PT  Initial Summary For PT   Imported By: Florinda Marker 07/24/2009 10:45:36  _____________________________________________________________________  External Attachment:    Type:   Image     Comment:   External Document

## 2010-05-29 NOTE — Assessment & Plan Note (Signed)
Summary: ACUTE-KNEE PAIN AND MEDS NOT WORKING(MILLS)/CFB   Vital Signs:  Patient profile:   45 year old female Height:      61 inches (154.94 cm) Weight:      349.5 pounds (158.86 kg) BMI:     66.28 Temp:     97.3 degrees F (36.28 degrees C) oral Pulse rate:   76 / minute BP sitting:   141 / 93  (left arm)  Vitals Entered By: Stanton Kidney Ditzler RN (March 30, 2010 9:44 AM) CC: Depression Is Patient Diabetic? No Pain Assessment Patient in pain? yes     Location: knees and hands Intensity: 8 Type: toothache Onset of pain  long time Nutritional Status BMI of > 30 = obese Nutritional Status Detail appetite good  Have you ever been in a relationship where you felt threatened, hurt or afraid?denies   Does patient need assistance? Functional Status Self care Ambulation Normal Comments Discuss med for knees.   Primary Care Provider:  Nelda Bucks DO  CC:  Depression.  History of Present Illness: Patient is a 45 year old woman who presents to clinic today because of bilateral knee pain, right worse the left.  She has had this pain for a long while and was recently sent to sports medicine.  Their last note for her states that her knee pain is likely underlying DJD coupled with ankle instability and abnormal gait secondary to her body habitus.  She states that the pain is getting worse and the medication that they gave her is not relieving the pain.  She states that she underwent physical therapy and that seemed to make it worse with increased pain and even a few episodes where her knees gave out after therapy. She denies any redness or warmth to the joints, fevers, chills, nausea, vomiting, or blood in the stool.    Her other medical problems include hypertension for which she is on lisinopril and hydrochlorothiazide.  She will be due for her next monitoring Bmet in February of 2012.    Depression History:      The patient denies a depressed mood most of the day and a diminished  interest in her usual daily activities.        The patient denies that she feels like life is not worth living, denies that she wishes that she were dead, and denies that she has thought about ending her life.         Preventive Screening-Counseling & Management  Alcohol-Tobacco     Alcohol drinks/day: 0     Alcohol type: BEER / WINE ABOUT EVERY DAY     Smoking Status: quit     Smoking Cessation Counseling: yes     Year Started: 2002/ 2-3 CIGS A DAY     Year Quit: 8/08     Passive Smoke Exposure: no  Caffeine-Diet-Exercise     Does Patient Exercise: no  Current Medications (verified): 1)  Zestoretic 20-25 Mg Tabs (Lisinopril-Hydrochlorothiazide) .... Take 1 Tablet By Mouth Daily. 2)  Dicyclomine Hcl 20 Mg Tabs (Dicyclomine Hcl) .... Take 1 Tablet By Mouth Four Times A Day For Seven Days 3)  Mobic 15 Mg Tabs (Meloxicam) .... Take One Daily With Food  Allergies: 1)  ! Iodine  Past History:  Past medical, surgical, family and social histories (including risk factors) reviewed, and no changes noted (except as noted below).  Past Medical History: Reviewed history from 07/19/2009 and no changes required. obesity L ear otitis with perforation GERD Hypertension Rheumatoid  arthritis- Rf neg? (-) for HIV 2007 Bipolar d/o (stop taking all meds b/c couldn't function)  Family History: Reviewed history from 07/19/2009 and no changes required. M- Arthritis, ESRD,DM, BP,   Social History: Reviewed history from 07/19/2009 and no changes required. 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 Tob- quit smoking (used to smoke as a job for [employer]) Alcohol- occasionally Crack- remote history (>15 yrs) SHe has 2 children who do not live with her.  Review of Systems       The patient complains of weight gain.  The patient denies anorexia, fever, weight loss, vision loss, decreased hearing, hoarseness, chest pain, syncope, dyspnea on exertion, peripheral  edema, prolonged cough, headaches, hemoptysis, abdominal pain, melena, hematochezia, severe indigestion/heartburn, hematuria, incontinence, genital sores, muscle weakness, suspicious skin lesions, transient blindness, difficulty walking, depression, unusual weight change, abnormal bleeding, enlarged lymph nodes, angioedema, and breast masses.    Physical Exam  Additional Exam:  General: Well developed, female in no acute distress.  Vital signs reviewed.  Head: Atraumatic, normocephalic with no signs of trauma  Eyes: PERRL, EOM intact  Nose: Nares patent, mucosa is pink and moist, no polyps noted  Mouth: Mucosa is pink and moist, good dention,   Neck: Supple, full ROM, no thyromegaly or masses noted  Resp: Clear to ascultation bilaterally, no wheezes, rales, or rhonchi noted  CV: Regular rate and rhythm with no murmurs, rubs, or gallops noted  Abdomen: Soft, non-tender, non-distended with normal bowel sounds  Musculoskelatal: Hip ROM normal, strength 5/5 in all planes with no pain to palpation.  Right knee is diffusely tender along the joint line, left is nontender.  There is limited flexion bilaterally secondary to habitus. Strength is 4/5 in flexion and extension on the right and seems limited by pain. Stength 5/5 on the left.  There is mild varus in the knees bilaterally.  Ankle ROM is normal and strength is 5/5 in all planes. Neurologic: Alert and oriented x3, Cranial nerves II-XII grossly intact, DTR normal and symmetric  Pulses: Radial, brachial, carotid, femoral, dorsal pedis, and posterior tibial pulses equal and symmetric     Impression & Recommendations:  Problem # 1:  KNEE PAIN, RIGHT (ICD-719.46) Her knee pain is likely secondary to degenerative joint disease which is likely secondary to her morbidly obese BMI.  BMI today is 14 which is increased since her last visit.  She is limited in her rehab secondary to pain.  She is willing to try physical therapy again but is worried about the  pain that she experienced with the last attempt.  We will continue her on her Mobic daily and give her a supply of Vicodin for before and after therapy with the hopes that she will be able to participate more in therapy and get maximal benefit.  She was advised that the medication can make her constipated and also that it can make her drowsy so she should not drive before she knows how the medication effects her.  This is also a temporary solution and that the supply of medication should only be kept as long as she is benefiting from therapy.    The following medications were removed from the medication list:    Tramadol Hcl 50 Mg Tabs (Tramadol hcl) .Marland Kitchen... Take 1-2 tabs every 4 hours as needed for pain.    Vicodin 5-500 Mg Tabs (Hydrocodone-acetaminophen) .Marland Kitchen... Take 1 tab by mouth every 4 hrs as needed for pain. Her updated medication list for  this problem includes:    Mobic 15 Mg Tabs (Meloxicam) .Marland Kitchen... Take one daily with food    Vicodin 5-500 Mg Tabs (Hydrocodone-acetaminophen) .Marland Kitchen... Take 1 tablet by mouth two times a day  Orders: Sports Medicine (Sports Med) Physical Therapy Referral (PT)  Problem # 2:  OBESITY (ICD-278.00) The majority of her problem stems from her morbid obesity.  Significant weight loss should significantly improve her pain.  She would qualify for bariatric surgery based on her BMI but she has no insurance at this time and the orange card does not have anyone who would do the surgery for her.  She was encouraged to work on weight loss with a combination of diet and exercise.  The vicodin should help decrease the pain so she can partake in exercise.    Ht: 61 (03/30/2010)   Wt: 349.5 (03/30/2010)   BMI: 66.28 (03/30/2010)  Problem # 3:  HYPERTENSION, ESSENTIAL NOS (ICD-401.9) Her blood pressure today is a bit elevated above what her goal would be but not enough where we need to make a change in her medication regimen.  We will continue to monitor and if she is elevated next  time may need to make adjustments to her medications. Her updated medication list for this problem includes:    Zestoretic 20-25 Mg Tabs (Lisinopril-hydrochlorothiazide) .Marland Kitchen... Take 1 tablet by mouth daily.  BP today: 141/93 Prior BP: 100/60 (07/19/2009)  Prior 10 Yr Risk Heart Disease: Not enough information (12/10/2006)  Labs Reviewed: K+: 4.3 (06/05/2009) Creat: : 0.69 (06/05/2009)   Chol: 153 (12/11/2006)   HDL: 48 (12/11/2006)   LDL: 87 (12/11/2006)   TG: 88 (12/11/2006)  Complete Medication List: 1)  Zestoretic 20-25 Mg Tabs (Lisinopril-hydrochlorothiazide) .... Take 1 tablet by mouth daily. 2)  Dicyclomine Hcl 20 Mg Tabs (Dicyclomine hcl) .... Take 1 tablet by mouth four times a day for seven days 3)  Mobic 15 Mg Tabs (Meloxicam) .... Take one daily with food 4)  Vicodin 5-500 Mg Tabs (Hydrocodone-acetaminophen) .... Take 1 tablet by mouth two times a day  Patient Instructions: 1)  We made a referral to sports medicine and physical therapy for strengthening and stability of your knees.   2)  You can take the VICODIN before therapy and one after to help with pain relief from the exercise. 3)  You need to work hard at losing weight.  The [employer] scholarship should be helpful.  Diet and exercise are the key! You can do it! 4)  Followup with Dr. Arvilla Market in 1 month Prescriptions: VICODIN 5-500 MG TABS (HYDROCODONE-ACETAMINOPHEN) Take 1 tablet by mouth two times a day  #60 x 1   Entered and Authorized by:   Leodis Sias MD   Signed by:   Leodis Sias MD on 03/30/2010   Method used:   Print then Give to Patient   RxID:   [id number]    Orders Added: 1)  Sports Medicine [Sports Med] 2)  Physical Therapy Referral [PT] 3)  Est. Patient Level III [14782]     Prevention & Chronic Care Immunizations   Influenza vaccine: refuses  (03/23/2007)   Influenza vaccine deferral: Refused  (06/05/2009)    Tetanus booster: Not documented   Td booster deferral: Deferred   (12/09/2008)    Pneumococcal vaccine: Not documented  Other Screening   Pap smear: Not documented   Pap smear action/deferral: Refused  (06/05/2009)    Mammogram: Not documented   Mammogram action/deferral: Deferred to age 61  (12/09/2008)   Smoking status: quit  (  03/30/2010)  Lipids   Total Cholesterol: 153  (12/11/2006)   LDL: 87  (12/11/2006)   LDL Direct: Not documented   HDL: 48  (12/11/2006)   Triglycerides: 88  (12/11/2006)  Hypertension   Last Blood Pressure: 141 / 93  (03/30/2010)   Serum creatinine: 0.69  (06/05/2009)   Serum potassium 4.3  (06/05/2009)    Hypertension flowsheet reviewed?: Yes   Progress toward BP goal: Deteriorated  Self-Management Support :   Personal Goals (by the next clinic visit) :      Personal blood pressure goal: 140/90  (06/05/2009)   Patient will work on the following items until the next clinic visit to reach self-care goals:     Medications and monitoring: take my medicines every day, bring all of my medications to every visit  (03/30/2010)     Eating: eat more vegetables, use fresh or frozen vegetables, eat foods that are low in salt, eat fruit for snacks and desserts, limit or avoid alcohol  (03/30/2010)     Activity: take a 30 minute walk every day, park at the far end of the parking lot  (03/30/2010)    Hypertension self-management support: Written self-care plan, Education handout, Resources for patients handout  (03/30/2010)   Hypertension self-care plan printed.   Hypertension education handout printed      Resource handout printed.  Appended Document: ACUTE-KNEE PAIN AND MEDS NOT WORKING(MILLS)/CFB I discussed the patient with Dr. Tonny Branch and I agree with the assessment and plan as outlined above. Losing the weight will be difficult with the activity restriction she has due to OA. She has yet to develop other sequela of obesoty. If she is appropriate candidate (motivated, mentally stable) gastric bypass would be key to her  future health.

## 2010-05-29 NOTE — Assessment & Plan Note (Signed)
Summary: knee pain/gg   Vital Signs:  Patient profile:   45 year old female Height:      61 inches (154.94 cm) Weight:      359.0 pounds (163.18 kg) BMI:     68.08 Pulse rate:   84 / minute BP sitting:   134 / 82  (left arm) Cuff size:   large (manual)  Vitals Entered By: Krystal Eaton Duncan Dull) (June 05, 2009 1:58 PM) CC: right knee pain (was seen in ER for same problem 2-3 mths) pain wakes her up at night Is Patient Diabetic? No Pain Assessment Patient in pain? yes     Location: right knee Intensity: 10 Type: aching Onset of pain  Constant x 2-3 mths Nutritional Status BMI of > 30 = obese  Have you ever been in a relationship where you felt threatened, hurt or afraid?No   Does patient need assistance? Functional Status Self care Ambulation Impaired:Risk for fall Comments 22/ right knee pain   Primary Care Provider:  Marinda Elk MD  CC:  right knee pain (was seen in ER for same problem 2-3 mths) pain wakes her up at night.  History of Present Illness: Carrie Harvey is a 45 y/o female with PMH significant for HTN, GERD, and ?Rheumatoid arthritis who presents to the clinic today for:  1) Knee pain - chronic knee pain but has recently increased in severity. Pain is aggravated with movement. She also mentions joint stiffness in the morning, which lasts for a few hours, which often gets better throughout the day. The pain is described as a dull ache, and it does not radiate anywhere else. There are no associated neurologic deficits. Pt reports she was seen in the ED in December for this knee pain. After viewing xrays, there was no evidence of subluxation, effusion or fracture and patient was discharged home with Vicodin. Pt reports the Vicodin did not manage her pain adequately. She also reports getting Diclofenac, but was unsure if that medication was for her heart, or for pain and thus has not been taking it. However pt reports taking Advil, Tyelenol and Aleve with  little relief of symptoms.   Preventive Screening-Counseling & Management  Alcohol-Tobacco     Alcohol drinks/day: 0     Alcohol type: BEER / WINE ABOUT EVERY DAY     Smoking Status: quit     Smoking Cessation Counseling: yes     Year Started: 2002/ 2-3 CIGS A DAY     Year Quit: 8/08     Passive Smoke Exposure: no  Current Medications (verified): 1)  Zestoretic 20-25 Mg Tabs (Lisinopril-Hydrochlorothiazide) .... Take 1 Tablet By Mouth Daily. 2)  Prilosec 40 Mg Cpdr (Omeprazole) .... Take 1 Tablet By Mouth Two Times A Day 3)  Dicyclomine Hcl 20 Mg Tabs (Dicyclomine Hcl) .... Take 1 Tablet By Mouth Four Times A Day For Seven Days 4)  Diclofenac Sodium 75 Mg Tbec (Diclofenac Sodium) .... Two Times A Day  Allergies: 1)  ! Iodine  Past History:  Past Medical History: Last updated: 10/13/2007 obesity L ear otitis with perforation GERD Hypertension Rheumatoid arthritis- Rf neg? (-) for HIV 2007  Risk Factors: Alcohol Use: 0 (06/05/2009) Exercise: no (12/09/2008)  Risk Factors: Smoking Status: quit (06/05/2009) Passive Smoke Exposure: no (06/05/2009)  Review of Systems General:  Denies fever and weakness. CV:  Denies chest pain or discomfort. Resp:  Denies cough. GI:  Denies change in bowel habits. GU:  Denies dysuria, urinary frequency, and urinary hesitancy.  MS:  Complains of joint pain and joint swelling.  Physical Exam  General:  alert, well-developed, and overweight-appearing.   Head:  normocephalic and atraumatic.   Eyes:  vision grossly intact, pupils equal, pupils round, and pupils reactive to light.   Neck:  supple, full ROM, and no masses.   Lungs:  normal respiratory effort and normal breath sounds.   Heart:  normal rate and regular rhythm.   Msk:  no redness over joints, no joint deformities, no joint instability, no crepitation, decreased ROM, joint tenderness, joint swelling, and joint warmth.   Extremities:  no edema noted    Impression &  Recommendations:  Problem # 1:  KNEE PAIN, RIGHT (ICD-719.46) Assessment New Right knee pain is secondary to degenerative changes as noted per xray done 03/2009. Joint space appeared intact when compared to prior images. Physical exam was positive for some mild inflammation, however it was very minimal, not indicated for knee injection/drainage. Not associated with fevers, and did not appear septic. I encouraged patient to continue with exercise and weight loss as it will help long-term outcome of arthritis.  Plan: Flector patch applied to affected knee If flector patch is unsuccessful then advised patient to use voltaren gel.  Also set up patient with physical therapy. If inflammation increases, and pain worsens, pt may need knee injection and drainage.   Her updated medication list for this problem includes:    Diclofenac Sodium 75 Mg Tbec (Diclofenac sodium) .Marland Kitchen..Marland Kitchen Two times a day  Orders: Physical Therapy Referral (PT)  Problem # 2:  HYPERTENSION, ESSENTIAL NOS (ICD-401.9) Assessment: Comment Only Blood pressure is well controlled, will continue current regimen. Check BMet for renal function and K.  Her updated medication list for this problem includes:    Zestoretic 20-25 Mg Tabs (Lisinopril-hydrochlorothiazide) .Marland Kitchen... Take 1 tablet by mouth daily.  Orders: T-Basic Metabolic Panel [id number])  BP today: 134/82 Prior BP: 133/85 (12/09/2008)  Prior 10 Yr Risk Heart Disease: Not enough information (12/10/2006)  Labs Reviewed: K+: 3.8 (12/09/2008) Creat: : 0.79 (12/09/2008)   Chol: 153 (12/11/2006)   HDL: 48 (12/11/2006)   LDL: 87 (12/11/2006)   TG: 88 (12/11/2006)  Complete Medication List: 1)  Zestoretic 20-25 Mg Tabs (Lisinopril-hydrochlorothiazide) .... Take 1 tablet by mouth daily. 2)  Prilosec 40 Mg Cpdr (Omeprazole) .... Take 1 tablet by mouth two times a day 3)  Dicyclomine Hcl 20 Mg Tabs (Dicyclomine hcl) .... Take 1 tablet by mouth four times a day for seven days 4)   Diclofenac Sodium 75 Mg Tbec (Diclofenac sodium) .... Two times a day 5)  Voltaren 1 % Gel (Diclofenac sodium) .... Apply a small amount to affected area 1-2 times daily as needed for pain. 6)  Flector 1.3 % Ptch (Diclofenac epolamine) .... Apply 1 patch to affected area twice daily.  Patient Instructions: 1)  Please follow up with physical therapy.  2)  Please take medications as directed for knee pain.  3)  Please contact clinic for worsening knee pain.  4)  It is important that you exercise regularly at least 20 minutes 5 times a week. If you develop chest pain, have severe difficulty breathing, or feel very tired , stop exercising immediately and seek medical attention. 5)  You need to lose weight. Consider a lower calorie diet and regular exercise.  Prescriptions: FLECTOR 1.3 % PTCH (DICLOFENAC EPOLAMINE) Apply 1 patch to affected area twice daily.  #60 x 3   Entered and Authorized by:   Melida Quitter MD  Signed by:   Melida Quitter MD on 06/05/2009   Method used:   Print then Give to Patient   RxID:   [id number] VOLTAREN 1 % GEL (DICLOFENAC SODIUM) Apply a small amount to affected area 1-2 times daily as needed for pain.  #1 x 3   Entered and Authorized by:   Melida Quitter MD   Signed by:   Melida Quitter MD on 06/05/2009   Method used:   Print then Give to Patient   RxID:   [id number] DICLOFENAC SODIUM 75 MG TBEC (DICLOFENAC SODIUM) two times a day  #60 x 0   Entered and Authorized by:   Melida Quitter MD   Signed by:   Melida Quitter MD on 06/05/2009   Method used:   Print then Give to Patient   RxID:   [id number]   Prevention & Chronic Care Immunizations   Influenza vaccine: refuses  (03/23/2007)   Influenza vaccine deferral: Refused  (06/05/2009)    Tetanus booster: Not documented   Td booster deferral: Deferred  (12/09/2008)    Pneumococcal vaccine: Not documented  Other Screening   Pap smear: Not documented   Pap smear action/deferral: Refused   (06/05/2009)    Mammogram: Not documented   Mammogram action/deferral: Deferred to age 7  (12/09/2008)   Smoking status: quit  (06/05/2009)  Lipids   Total Cholesterol: 153  (12/11/2006)   LDL: 87  (12/11/2006)   LDL Direct: Not documented   HDL: 48  (12/11/2006)   Triglycerides: 88  (12/11/2006)  Hypertension   Last Blood Pressure: 134 / 82  (06/05/2009)   Serum creatinine: 0.79  (12/09/2008)   Serum potassium 3.8  (12/09/2008)    Hypertension flowsheet reviewed?: Yes   Progress toward BP goal: At goal  Self-Management Support :   Personal Goals (by the next clinic visit) :      Personal blood pressure goal: 140/90  (06/05/2009)   Patient will work on the following items until the next clinic visit to reach self-care goals:     Medications and monitoring: take my medicines every day  (06/05/2009)     Eating: eat more vegetables, eat foods that are low in salt  (06/05/2009)     Activity: take a 30 minute walk every day  (12/09/2008)    Hypertension self-management support: Written self-care plan  (12/09/2008)  Process Orders Check Orders Results:     [facility]: ABN not required for this insurance Tests Sent for requisitioning (June 05, 2009 4:13 PM):     06/05/2009: [facility] -- T-Basic Metabolic Panel [id number] (signed)    Process Orders Check Orders Results:     [facility]: ABN not required for this insurance Tests Sent for requisitioning (June 05, 2009 4:13 PM):     06/05/2009: [facility] -- T-Basic Metabolic Panel [phone] (signed)

## 2010-05-29 NOTE — Progress Notes (Signed)
Summary: phone/gg  Phone Note Call from Patient   Caller: Patient Summary of Call: Pt  states rt knee is hurting and keeping her up at night.  This knee was xrayed a few months ago.  She was to get a walker but can't afford. area around patella is sore to touch. Will see today Initial call taken by: Merrie Roof RN,  June 05, 2009 10:30 AM

## 2010-05-31 NOTE — Assessment & Plan Note (Signed)
Summary: LEGS/SB.   Vital Signs:  Patient profile:   45 year old female Height:      61 inches (154.94 cm) Weight:      351.8 pounds (159.91 kg) BMI:     66.71 Temp:     97.4 degrees F (36.33 degrees C) oral Pulse rate:   79 / minute BP sitting:   131 / 85  (left arm)  Vitals Entered By: Stanton Kidney Ditzler RN (May 09, 2010 8:42 AM) Is Patient Diabetic? No Pain Assessment Patient in pain? yes     Location: left knee Intensity: 9.5 Type: aching Onset of pain  long time Nutritional Status BMI of > 30 = obese Nutritional Status Detail appetite good  Have you ever been in a relationship where you felt threatened, hurt or afraid?denies   Does patient need assistance? Functional Status Self care Ambulation Normal Comments FU left knee - no change.   Primary Care Provider:  Nelda Bucks DO   History of Present Illness: 45yo W with obesity, OA of knees and chronic L knee pain, HTN, and IBS who presents for routine f/u. She is generally feeling well, although she continues to have pain in left knee, for which she is seen regularly by sports med and for which she is currently undergoing physical therapy. She is trying to lose weight, although she admits to struggling with this over the holidays. She is keeping a food diary, drinking a slim fast shake in the morning, watching her calories, and reducing carbs. [facility] and is trying to find exercises that she can do without hurting her knee. She has been referred for possible bariatric surgery and is planning to attend a class run by the bariatric surgery center. She is aware of the process of preparing for bariatric surgery and knows that she will need to work on her diet and lifestyle for several months to a year leadings up to possible surgery. She is a bit afraid of bariatric surgery and would like to try losing weight on her own first anyway.  Depression History:      The patient denies a depressed mood most of the  day and a diminished interest in her usual daily activities.         Preventive Screening-Counseling & Management  Alcohol-Tobacco     Alcohol drinks/day: 0     Alcohol type: BEER / WINE ABOUT EVERY DAY     Smoking Status: quit     Smoking Cessation Counseling: yes     Year Started: 2002/ 2-3 CIGS A DAY     Year Quit: 8/08     Passive Smoke Exposure: no  Caffeine-Diet-Exercise     Does Patient Exercise: no  Current Medications (verified): 1)  Zestoretic 20-25 Mg Tabs (Lisinopril-Hydrochlorothiazide) .... Take 1 Tablet By Mouth Daily. 2)  Dicyclomine Hcl 20 Mg Tabs (Dicyclomine Hcl) .... Take 1 Tablet By Mouth Four Times A Day For Seven Days 3)  Mobic 15 Mg Tabs (Meloxicam) .... Take One Daily With Food 4)  Vicodin 5-500 Mg Tabs (Hydrocodone-Acetaminophen) .... Take 1 Tablet By Mouth Two Times A Day  Allergies: 1)  ! Iodine  Past History:  Past Medical History: Last updated: 07/19/2009 obesity L ear otitis with perforation GERD Hypertension Rheumatoid arthritis- Rf neg? 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 Tob- quit smoking (used to smoke as a job for [employer]) Alcohol- occasionally Crack- remote history (>15 yrs) SHe has 2 children who do not live with her.  Review of Systems      See HPI General:  Denies chills and fever. CV:  Denies chest pain or discomfort. Resp:  Denies cough and shortness of breath. GI:  Denies abdominal pain and change in bowel habits. MS:  Complains of joint pain and joint swelling; denies joint redness; L knee pain and swelling. Neuro:  Denies falling down, numbness, and tingling. Endo:  Denies weight change.  Physical Exam  General:  alert, cooperative to examination, and overweight-appearing.   Head:   normocephalic and atraumatic.   Eyes:  vision grossly intact, pupils equal, pupils round, and pupils reactive to light.   Mouth:  pharynx pink and moist.   Neck:  supple and no masses.   Lungs:  normal breath sounds, no crackles, and no wheezes.   Heart:  normal rate, regular rhythm, no murmur, no gallop, and no rub.   Abdomen:  soft and non-tender.   Msk:  L knee tender to palpation and slightly swollen.  Extremities:  No edema.  Neurologic:  alert & oriented X3, cranial nerves grossly intact, strength normal in all extremities, and sensation intact to light touch.   Skin:  turgor normal and no rashes.   Psych:  Oriented X3, memory intact for recent and remote, normally interactive, good eye contact, not anxious appearing, and not depressed appearing.     Impression & Recommendations:  Problem # 1:  OBESITY (ICD-278.00) Assessment Unchanged Patient's weight is largely unchanged (she has actually gained a few pounds since last appointment). However, she seems well-informed about weight loss strategies and motivated to continue watching calories, increasing exercise, keeping a food diary, etc. She has been referred to bariatric surgery center and is starting the process with them (she is scheduled to attend a class run by the center). I think she may be an excellent candidate for bariatric surgery. I reinforced her current weight loss efforts and offered her the option of frequent cliinic follow-up if she would like our help monitoring her progress.   Problem # 2:  LEG PAIN, LEFT (ICD-729.5) Seen by sports medicine and physical therapy for osteoarthritis of knees and chronic knee pain (L>R). Pain controlled with Vicodin, which I refilled today. Encouraged continued PT and weight loss.   Problem # 3:  IRRITABLE BOWEL SYNDROME (ICD-564.1) Currently stable without symptoms. Takes dicylcomine for flares and typically responds well.   Problem # 4:  HYPERTENSION, ESSENTIAL NOS (ICD-401.9) Stable.  Continue Zestoric.   Her updated medication list for this problem includes:    Zestoretic 20-25 Mg Tabs (Lisinopril-hydrochlorothiazide) .Marland Kitchen... Take 1 tablet by mouth daily.  Complete Medication List: 1)  Zestoretic 20-25 Mg Tabs (Lisinopril-hydrochlorothiazide) .... Take 1 tablet by mouth daily. 2)  Dicyclomine Hcl 20 Mg Tabs (Dicyclomine hcl) .... Take 1 tablet by mouth four times a day for seven days 3)  Mobic 15 Mg Tabs (Meloxicam) .... Take one daily with food 4)  Vicodin 5-500 Mg Tabs (Hydrocodone-acetaminophen) .... Take 1 tablet by mouth two times a day  Patient Instructions: 1)  Please schedule a follow-up appointment in 1-3 months. 2)  You need to lose weight. Consider a lower calorie diet and regular exercise.  Prescriptions: VICODIN 5-500 MG TABS (HYDROCODONE-ACETAMINOPHEN) Take 1 tablet by mouth two times a day  #60 x 1  Entered and Authorized by:   Whitney Post MD   Signed by:   Whitney Post MD on 05/09/2010   Method used:   Print then Give to Patient   RxID:   [id number] DICYCLOMINE HCL 20 MG TABS (DICYCLOMINE HCL) Take 1 tablet by mouth four times a day for seven days  #28 x 6   Entered and Authorized by:   Whitney Post MD   Signed by:   Whitney Post MD on 05/09/2010   Method used:   Print then Give to Patient   RxID:   [id number] MOBIC 15 MG TABS (MELOXICAM) take one daily with food  #30 x 3   Entered and Authorized by:   Whitney Post MD   Signed by:   Whitney Post MD on 05/09/2010   Method used:   Print then Give to Patient   RxID:   [id number]    Orders Added: 1)  Est. Patient Level IV [09323]     Prevention & Chronic Care Immunizations   Influenza vaccine: refuses  (03/23/2007)   Influenza vaccine deferral: Refused  (06/05/2009)    Tetanus booster: Not documented   Td booster deferral: Deferred  (12/09/2008)    Pneumococcal vaccine: Not documented  Other Screening   Pap smear: Not documented   Pap smear action/deferral: Refused   (06/05/2009)    Mammogram: Not documented   Mammogram action/deferral: Deferred to age 65  (12/09/2008)   Smoking status: quit  (05/09/2010)  Lipids   Total Cholesterol: 153  (12/11/2006)   LDL: 87  (12/11/2006)   LDL Direct: Not documented   HDL: 48  (12/11/2006)   Triglycerides: 88  (12/11/2006)  Hypertension   Last Blood Pressure: 131 / 85  (05/09/2010)   Serum creatinine: 0.69  (06/05/2009)   Serum potassium 4.3  (06/05/2009)    Hypertension flowsheet reviewed?: Yes   Progress toward BP goal: Unchanged  Self-Management Support :   Personal Goals (by the next clinic visit) :      Personal blood pressure goal: 140/90  (06/05/2009)   Patient will work on the following items until the next clinic visit to reach self-care goals:     Medications and monitoring: take my medicines every day, bring all of my medications to every visit  (05/09/2010)     Eating: drink diet soda or water instead of juice or soda, eat more vegetables, use fresh or frozen vegetables, eat baked foods instead of fried foods, eat fruit for snacks and desserts, limit or avoid alcohol  (05/09/2010)     Activity: park at the far end of the parking lot  (05/09/2010)    Hypertension self-management support: Written self-care plan, Education handout, Resources for patients handout  (05/09/2010)   Hypertension self-care plan printed.   Hypertension education handout printed      Resource handout printed.

## 2010-05-31 NOTE — Assessment & Plan Note (Signed)
Summary: F/U LEFT [facility]   Primary Care Provider:  Nelda Bucks DO   History of Present Illness: B knee pain worse--wonders what her options are. left knee is 8-9 /10 and right is 5/10. Worse with stairs, walking. Pain is aching. located right in knee joint. No falls. has discussed getting into bariatric surgery program.   Nonew injury.  Current Medications (verified): 1)  Zestoretic 20-25 Mg Tabs (Lisinopril-Hydrochlorothiazide) .... Take 1 Tablet By Mouth Daily. 2)  Dicyclomine Hcl 20 Mg Tabs (Dicyclomine Hcl) .... Take 1 Tablet By Mouth Four Times A Day For Seven Days 3)  Mobic 15 Mg Tabs (Meloxicam) .... Take One Daily With Food 4)  Vicodin 5-500 Mg Tabs (Hydrocodone-Acetaminophen) .... Take 1 Tablet By Mouth Two Times A Day  Allergies: 1)  ! Iodine  Physical Exam  General:  alert, well-developed, well-nourished, and well-hydrated.  Morbidly obese Msk:  B knees without edffusion. Righttp medial joint line and left t ttp medial and lateral joint line.  from in ext and flexion. Calf is soft ligamentously intact   Impression & Recommendations:  Problem # 1:  LEG PAIN, LEFT (ICD-729.5) we discussed injection terapy and had consented her but she decided at the last minute she was too frightened tohave the shot. She iwll go home and think about it--perhaps bring her husband if she decides to rtc for injection.  Complete Medication List: 1)  Zestoretic 20-25 Mg Tabs (Lisinopril-hydrochlorothiazide) .... Take 1 tablet by mouth daily. 2)  Dicyclomine Hcl 20 Mg Tabs (Dicyclomine hcl) .... Take 1 tablet by mouth four times a day for seven days 3)  Mobic 15 Mg Tabs (Meloxicam) .... Take one daily with food 4)  Vicodin 5-500 Mg Tabs (Hydrocodone-acetaminophen) .... Take 1 tablet by mouth two times a day   Orders Added: 1)  Est. Patient Level III [16109]

## 2010-06-05 ENCOUNTER — Other Ambulatory Visit: Payer: Self-pay | Admitting: *Deleted

## 2010-06-06 MED ORDER — LISINOPRIL-HYDROCHLOROTHIAZIDE 20-25 MG PO TABS: 1.0000 | ORAL_TABLET | Freq: Every day | ORAL | Status: DC

## 2010-06-06 NOTE — Telephone Encounter (Signed)
Pt informed and she will make appointment in next 2 months.

## 2010-06-06 NOTE — Telephone Encounter (Signed)
I will refill for 2 months until she can be seen- needs a yearly appointment at least.

## 2010-06-06 NOTE — Telephone Encounter (Signed)
Pt is out of meds, please refill Last labs 06/05/09

## 2010-06-11 ENCOUNTER — Emergency Department (HOSPITAL_COMMUNITY)
Admission: EM | Admit: 2010-06-11 | Discharge: 2010-06-11 | Disposition: A | Discharge status: Home / Self Care | Payer: Self-pay | Attending: Emergency Medicine | Admitting: Emergency Medicine

## 2010-06-11 DIAGNOSIS — N949 Unspecified condition associated with female genital organs and menstrual cycle: Secondary | ICD-10-CM | POA: Insufficient documentation

## 2010-06-11 DIAGNOSIS — R350 Frequency of micturition: Secondary | ICD-10-CM | POA: Insufficient documentation

## 2010-06-11 LAB — URINALYSIS, ROUTINE W REFLEX MICROSCOPIC
Bilirubin Urine: NEGATIVE
Hgb urine dipstick: NEGATIVE
Nitrite: NEGATIVE
Specific Gravity, Urine: 1.02 (ref 1.005–1.030)
Urobilinogen, UA: 1 mg/dL (ref 0.0–1.0)
pH: 6.5 (ref 5.0–8.0)

## 2010-06-11 LAB — POCT PREGNANCY, URINE: Preg Test, Ur: NEGATIVE

## 2010-06-19 ENCOUNTER — Ambulatory Visit: Payer: Self-pay | Admitting: Internal Medicine

## 2010-06-21 ENCOUNTER — Ambulatory Visit: Payer: Self-pay | Admitting: Internal Medicine

## 2010-07-11 ENCOUNTER — Ambulatory Visit: Payer: Self-pay | Admitting: Internal Medicine

## 2010-07-26 ENCOUNTER — Other Ambulatory Visit: Payer: Self-pay | Admitting: *Deleted

## 2010-07-27 MED ORDER — HYDROCODONE-ACETAMINOPHEN 5-500 MG PO TABS: 1.0000 | ORAL_TABLET | Freq: Two times a day (BID) | ORAL | Status: DC

## 2010-07-27 NOTE — Telephone Encounter (Signed)
Script called to pharm

## 2010-08-07 ENCOUNTER — Ambulatory Visit: Payer: Self-pay | Admitting: Internal Medicine

## 2010-08-07 LAB — POCT CARDIAC MARKERS
Myoglobin, poc: 43.1 ng/mL (ref 12–200)
Troponin i, poc: <0.05 ng/mL (ref 0.00–0.09)

## 2010-08-07 LAB — RAPID URINE DRUG SCREEN, HOSP PERFORMED
Barbiturates: NOT DETECTED
Opiates: POSITIVE — AB

## 2010-08-20 ENCOUNTER — Ambulatory Visit (INDEPENDENT_AMBULATORY_CARE_PROVIDER_SITE_OTHER): Payer: Self-pay | Admitting: Internal Medicine

## 2010-08-20 ENCOUNTER — Encounter: Payer: Self-pay | Admitting: Internal Medicine

## 2010-08-20 VITALS — BP 129/78 | HR 90 | Temp 97.2°F | Resp 20 | Ht 61.0 in | Wt 344.8 lb

## 2010-08-20 DIAGNOSIS — M25569 Pain in unspecified knee: Secondary | ICD-10-CM

## 2010-08-20 DIAGNOSIS — I10 Essential (primary) hypertension: Secondary | ICD-10-CM

## 2010-08-20 DIAGNOSIS — M069 Rheumatoid arthritis, unspecified: Secondary | ICD-10-CM

## 2010-08-20 DIAGNOSIS — E669 Obesity, unspecified: Secondary | ICD-10-CM

## 2010-08-20 MED ORDER — HYDROCODONE-ACETAMINOPHEN 5-500 MG PO TABS: 1.0000 | ORAL_TABLET | Freq: Two times a day (BID) | ORAL | Status: DC

## 2010-08-20 MED ORDER — MELOXICAM 15 MG PO TABS: 15.0000 mg | ORAL_TABLET | Freq: Every day | ORAL | Status: DC

## 2010-08-20 NOTE — Progress Notes (Signed)
  Subjective:    Patient ID: Carrie Harvey, female    DOB: [birth year], 45 y.o.   MRN: [id number]  HPI Patient is a 45 year old female who is morbidly obese, has a history of hypertension, seronegative arthritis, and chronic back pain. She's here for a followup and has following complaints #1 knee pain-patient continues to have severe knee pain. She got to the sports medicine physician and was offered a steroid shot to the knee. 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 She also was told that the injection may not help. This along with the fear of needles made her refused the injection.  #2 weight loss-patient says that she is unable to lose her weight on her. She has been to the counseling for bariatric surgery and has to wait for one year to try to lose weight on her own. She wonders if she can take some weight loss pills over-the-counter. #3 feeling cold-patient reports at times right before her menstruation she feels very cold. She hasn't been checked for thyroid in some time. Thyroid problem runs in her family. She would like to get checked for.   Review of Systems  Constitutional: Positive for unexpected weight change.  HENT: Negative.   Eyes: Negative.   Respiratory: Negative.   Cardiovascular: Negative.   Gastrointestinal: Negative.   Genitourinary: Negative.   Musculoskeletal: Positive for joint swelling, arthralgias and gait problem.  Neurological: Negative.   Hematological: Negative.   Psychiatric/Behavioral: Negative.        Objective:   Physical Exam  Constitutional: She is oriented to person, place, and time. Vital signs are normal. She appears well-developed and well-nourished. No distress.  Neck: Normal range of motion. Neck supple. No thyromegaly present.  Cardiovascular: Normal rate and regular rhythm.   Pulmonary/Chest: Effort normal and breath sounds normal.  Abdominal: Soft. Bowel sounds are normal. She exhibits no distension. There is no tenderness.    Musculoskeletal: Normal range of motion.  Neurological: She is alert and oriented to person, place, and time. She has normal reflexes.  Psychiatric: She has a normal mood and affect. Her behavior is normal. Thought content normal.          Assessment & Plan:

## 2010-08-20 NOTE — Patient Instructions (Signed)
Follow up in a month. Go to the sports medicine physician for steroid injection. Calorie Counting Diet A calorie counting diet requires you to eat the number of calories that are right for you during a day. Calories are the measurement of how much energy you get from the food you eat. Eating the right amount of calories is important for staying at a healthy weight. If you eat too many calories your body will store them as fat and you may gain weight. If you eat too few calories you may lose weight. Counting the number of calories that you eat during a day will help you to know if you're eating the right amount. A Registered Dietitian can determine how many calories you need in a day. The amount of calories you need varies from person to person. If your goal is to lose weight you will need to eat fewer calories. Losing weight can benefit you if you are overweight or have health problems such as heart disease, high blood pressure or diabetes. If your goal is to gain weight, you will need to eat more calories. Gaining weight may be necessary if you have a certain health problem that causes your body to need more energy. TIPS Whether you are increasing or decreasing the number of calories you eat during a day, it may be hard to get used to changing what you eat and drink. The following are tips to help you keep track of the number of calories you are eating.  Measuring foods at home with measuring cups will help you to know the actual amount of food and number of calories you are eating.   Restaurants serve food in all different portion sizes. It is common that restaurants will serve food in amounts worth 2 or more serving sizes. While eating out, it may be helpful to estimate how many servings of a food you are given. For example, a serving of cooked rice is 1/2 cup and that is the size of half of a fist. Knowing serving sizes will help you have a better idea of how much food you are eating at restaurants.     Ask for smaller portion sizes or child-size portions at restaurants.   Plan to eat half of a meal at a restaurant and take the rest home or share the other half with a friend   Read food labels for calorie content and serving size   Most packaged food has a Nutrition Facts Panel on its side or back. Here you can find out how many servings are in a package, the size of a serving, and the number of calories each serving has.   The serving size and number of servings per container are listed right below the Nutrition Facts heading. Just below the serving information, the number of calories in each serving is listed.   For example, say that a package has three cookies inside. The Nutrition Facts panel says that one serving is one cookie. Below that, it says that there are three servings in the container. The calories section of the Nutrition Facts says there are 90 calories. That means that there are 90 calories in one cookie. If you eat one cookie you have eaten 90 calories. If you eat all three cookies, you have eaten three times that amount, or 270 calories.  The list below tells you how big or small some common portion sizes are.  1 ounce (oz).................4 stacked dice.   3 oz.............................Marland KitchenDeck of cards.  1 teaspoon (tsp)..........Marland KitchenTip of little finger.   1 tablespoon (Tbsp).Marland KitchenMarland KitchenMarland KitchenTip of thumb.   2 Tbsp.........................Marland KitchenGolf ball.    Cup.........................Marland KitchenHalf of a fist.   1 Cup..........................Marland KitchenA fist.  KEEP A FOOD LOG Write down every food item that you eat, how much of the food you eat, and the number of calories in each food that you eat during the day. At the end of the day or throughout the day you can add up the total number of calories you have eaten.  It may help to set up a list like the one below. Find out the calorie information by reading food labels.  Breakfast   Bran Flakes (1 cup, 110 calories).   Fat free milk ( cup,  45 calories).   Snack   Apple (1 medium, 80 calories).   Lunch   Spinach (1 cup, 20 calories).   Tomato ( medium, 20 calories).   Chicken breast strips (3 oz, 165 calories).   Shredded cheddar cheese ( cup, 110 calories).   Light [place] dressing (2 Tbsp, 60 calories).   Whole wheat bread (1 slice, 80 calories).   Tub margarine (1 tsp, 35 calories).   Vegetable soup (1 cup, 160 calories).   Dinner   Pork chop (3 oz, 190 calories).   Brown rice (1 cup, 215 calories).   Steamed broccoli ( cup, 20 calories).   Strawberries (1  cup, 65 calories).   Whipped cream (1 Tbsp, 50 calories).  Daily Calorie Total: 1425 Information from www.eatright.org, Foodwise Nutritional Analysis Database. Document Released: 04/15/2005 Document Re-Released: 05/07/2009 [facility] Patient Information 2011 [place], [place].

## 2010-08-20 NOTE — Assessment & Plan Note (Signed)
BMI of 65, talked at length about calorie counting and restriction diet, diet diary, weight watcher programs. She wanted diet pill but I told her that all over the counter medicines have substances with possible risky side effects and I can not vouch for them. Encouraged her and provided her sample 1500 calorie diet. She will continue to follow with the bariatric surgery center.

## 2010-08-20 NOTE — Assessment & Plan Note (Signed)
BP controlled. No changes in the medications.

## 2010-08-20 NOTE — Assessment & Plan Note (Signed)
WE talked about possible benefits with the steroid injections. She does not have access to rheumatologist. Refilled her pain pills.

## 2010-08-21 LAB — TSH: TSH: 1.483 u[IU]/mL (ref 0.350–4.500)

## 2010-08-21 LAB — BASIC METABOLIC PANEL
BUN: 10 mg/dL (ref 6–23)
Calcium: 9.6 mg/dL (ref 8.4–10.5)
Potassium: 4 mEq/L (ref 3.5–5.3)
Sodium: 139 mEq/L (ref 135–145)

## 2010-08-21 LAB — CBC
MCH: 28.6 pg (ref 26.0–34.0)
MCHC: 31.1 g/dL (ref 30.0–36.0)
RDW: 13.7 % (ref 11.5–15.5)

## 2010-08-31 ENCOUNTER — Ambulatory Visit: Payer: Self-pay | Admitting: Family Medicine

## 2010-09-04 ENCOUNTER — Ambulatory Visit: Payer: Self-pay | Admitting: Family Medicine

## 2010-09-14 NOTE — Group Therapy Note (Signed)
NAME:  Carrie Harvey, Carrie Harvey NO.:  [id number]   MEDICAL RECORD NO.:  [id number]          PATIENT TYPE:  WOC   LOCATION:  [facility]                   FACILITY:  [facility]   PHYSICIAN:  Argentina Donovan, MD        DATE OF BIRTH:  11-16-1965   DATE OF SERVICE:                                    CLINIC NOTE   HISTORY OF PRESENT ILLNESS:  The patient is a 45 year old black female,  gravida 2, para 2-0-0-2 with a history of 2 cesarean sections, the first  being a breech, and second just a repeat, who had a tubal ligation after her  last baby and who has been in pretty good health with the exception of an  extreme morbid obesity at 5 feet 1 inches tall and 332 pounds.  She has been  seen in the medical clinic, and has been on Protonix and atenolol for  hypertension and GERD.  She also has been worked up for arthritis syndrome.  From a gynecological point of view, the patient has had normal periods until  about 5 months ago when she began having heavier periods, and instead of 3  days, going on 5-7 days, and causing a lot more pain, especially  premenstrual, severe pain down into her legs and her lower abdomen that  sometimes makes it impossible for her to stand up.  Also, she reported that  she has had urinary incontinence for the last 3 weeks, but does not  attribute it to any stress or urge incontinence, and the only change in her  urinary habits has been frequency of urination recently.  In questioning her  about her incontinence, she says that she changed a panty liner frequently,  and it is wet but not wringing wet.  She also does not wet the bed, and my  impression after my examination with her is that this is due to a vaginal  and perineal moisture rather than incontinence.   PHYSICAL EXAMINATION:  GENITOURINARY:  The external genitalia is normal.  BUS within normal limits.  There was no significant descensus of the bladder  when she is asked to bear down or cough.  The vagina is  clean and well  rugated, but marked amount of clear mucous moisture.  Cervix is nulliparous  and clean.  Pap smear was taken.  The adnexa and uterus could not be  palpated because of the habitus of the patient.   IMPRESSION:  1.  Increasing dysmenorrhea and menorrhagia.  2.  Perineal and vaginal moisture rather than stress incontinence.   PLAN:  Will get an ultrasound of the pelvis.  Indeed, I do not feel that it  is mandatory to send this patient at this time for a urinary consult as I do  not believe we are dealing with stress incontinence.      PR/MEDQ  D:  08/09/2004  T:  08/09/2004  Job:  [id number]

## 2010-09-25 ENCOUNTER — Ambulatory Visit: Payer: Self-pay | Admitting: Family Medicine

## 2010-09-26 ENCOUNTER — Telehealth: Payer: Self-pay | Admitting: *Deleted

## 2010-09-26 NOTE — Telephone Encounter (Signed)
I think another day should be ok

## 2010-09-26 NOTE — Telephone Encounter (Signed)
Pt called with c/o abd pain.  Pills dicyclomine are not helping  Pain is constant.  She has diarrhea.  Denies N/Vomiting. This abd  pain is more severe than usual.  When she takes dicyclomine it usually works in 1 day. Yesterday she  only drank  sprite, if eats has diarrhea. No other problems.  Should she wait another day and continue taking dicyclomine or does she need to be seen?

## 2010-09-26 NOTE — Telephone Encounter (Signed)
Pt informed.  Appointment given for tomorrow at 1015 if needed and pain has not resolved. She will cancel if not needed.

## 2010-09-27 ENCOUNTER — Ambulatory Visit: Payer: Self-pay | Admitting: Internal Medicine

## 2010-09-27 ENCOUNTER — Other Ambulatory Visit: Payer: Self-pay | Admitting: Internal Medicine

## 2010-10-02 ENCOUNTER — Encounter: Payer: Self-pay | Admitting: Internal Medicine

## 2010-10-09 ENCOUNTER — Ambulatory Visit: Payer: Self-pay | Admitting: Family Medicine

## 2010-10-11 ENCOUNTER — Ambulatory Visit: Payer: Self-pay | Admitting: Family Medicine

## 2010-10-15 ENCOUNTER — Encounter: Payer: Self-pay | Admitting: Internal Medicine

## 2010-10-18 ENCOUNTER — Ambulatory Visit: Payer: Self-pay | Admitting: Family Medicine

## 2010-10-30 ENCOUNTER — Telehealth: Payer: Self-pay | Admitting: Internal Medicine

## 2010-10-30 NOTE — Telephone Encounter (Signed)
Returned a phone call from Ms. Lorman.  She has had some problems with chest congestions, coughing, and general malaise for the last 2 days.  She has been taking OTC chest congestion with mucus relief medications which help some.  She denies any fevers, chills, nausea or vomiting.  She has also had diarrhea with several non-bloody bowel movements for the last 2 days as well.  She states she has been able to eat small amounts and has been able to keep fluids down.    We discussed self care for viral URIs including lots of fluids, sleep, and OTC medications for the symptoms.  She voices understanding.  We also discussed when to seek help including fever >101F that is not relieve by tylenol as well as increasing SOB or inability to keep down enough fluids.  She voiced understanding.    I will flag this to the front desk pool to make her an appointment either Thursday or Friday this week or even early next week that she can come to if she continues to not feel well.

## 2010-11-05 ENCOUNTER — Encounter: Payer: Self-pay | Admitting: Internal Medicine

## 2010-11-15 ENCOUNTER — Telehealth: Payer: Self-pay | Admitting: *Deleted

## 2010-11-15 NOTE — Telephone Encounter (Signed)
Pt called with headache for past 4 days, vision blurry yesterday, rates pain 6/10. Pain is to top of head.  Constant. Has taken tylenol with some relief.   Denies any other symptoms. BP today 161/108.  Taking BP meds.  Pt not able to come in today, will schedule for tomorrow

## 2010-11-16 ENCOUNTER — Ambulatory Visit: Payer: Self-pay | Admitting: Internal Medicine

## 2010-11-27 ENCOUNTER — Other Ambulatory Visit: Payer: Self-pay | Admitting: *Deleted

## 2010-11-27 MED ORDER — HYDROCODONE-ACETAMINOPHEN 5-500 MG PO TABS: 1.0000 | ORAL_TABLET | Freq: Two times a day (BID) | ORAL | Status: DC

## 2010-11-27 NOTE — Telephone Encounter (Signed)
Refill called to the [facility] on [address].

## 2010-12-13 ENCOUNTER — Encounter: Payer: Self-pay | Admitting: Internal Medicine

## 2010-12-13 ENCOUNTER — Ambulatory Visit (INDEPENDENT_AMBULATORY_CARE_PROVIDER_SITE_OTHER): Payer: Self-pay | Admitting: Internal Medicine

## 2010-12-13 VITALS — BP 130/84 | HR 72 | Temp 98.1°F | Ht 61.0 in | Wt 351.7 lb

## 2010-12-13 DIAGNOSIS — R519 Headache, unspecified: Secondary | ICD-10-CM | POA: Insufficient documentation

## 2010-12-13 DIAGNOSIS — R109 Unspecified abdominal pain: Secondary | ICD-10-CM | POA: Insufficient documentation

## 2010-12-13 DIAGNOSIS — R51 Headache: Secondary | ICD-10-CM

## 2010-12-13 DIAGNOSIS — I10 Essential (primary) hypertension: Secondary | ICD-10-CM

## 2010-12-13 LAB — POCT URINALYSIS DIPSTICK
Bilirubin, UA: NEGATIVE
Ketones, UA: NEGATIVE
Leukocytes, UA: NEGATIVE
Nitrite, UA: NEGATIVE
Protein, UA: NEGATIVE

## 2010-12-13 MED ORDER — IBUPROFEN 200 MG PO TABS: 200.0000 mg | ORAL_TABLET | Freq: Four times a day (QID) | ORAL | Status: DC | PRN

## 2010-12-13 MED ORDER — OMEPRAZOLE MAGNESIUM 20 MG PO TBEC: 20.0000 mg | DELAYED_RELEASE_TABLET | Freq: Every day | ORAL | Status: DC

## 2010-12-13 NOTE — Assessment & Plan Note (Signed)
She complains of right-sided flank pain and increased frequency. Denies any dysuria, fever, chills nausea or vomiting. Urine dipstick was negative for any infection. I do not think that it is pyelonephritis , most likely her pain is musculoskeletal in origin as she might have slept on that side. Would treat her pain symptomatically with NSAIDs for now.

## 2010-12-13 NOTE — Assessment & Plan Note (Signed)
She complains of new onset frontal headaches for past 2- 3 days. Differentials include stress versus migraines versus cluster headaches versus sinusitis. Cluster headaches are very very less likely. Sinusitis is also less likely in the absence of any congestion, cough, postnasal drip. This is her first episode and she does not fit into any classic types of headaches. Would treat her symptomatically for pain with NSAIDs at this time. She was advised to call the clinic if her symptoms persist or get worse.

## 2010-12-13 NOTE — Progress Notes (Signed)
  Subjective:    Patient ID: Carrie Harvey, female    DOB: [birth year], 45 y.o.   MRN: [id number]  HPI: 45 year old woman with past medical history significant for morbid obesity, hypertension comes to the clinic for headaches for 2-3 days.  Patient states that for the last 2-3 days she has been having headaches. The headaches are present almost every time, rates her pain 6/10 describes the pain as dull achy, located in frontal region without any radiation. States that sleeping in a dark room helps the headaches. Denies any associated nausea, dizziness, blurry vision associated with it. Also denies any cough, congestion, runny nose, postnasal drip or sore throat. She has never had these kind of headaches before. She also reports having left-sided flank pain for last 2-3 days. Describes her pain as dull, 5/10 with no radiation. She states that she has been going to the bathroom more often. States that she has been drinking a lot of water and soda as to. Denies any dysuria, urgency, suprapubic discomfort, fever, chills or vaginal discharge       Review of Systems  Constitutional: Negative for fever and chills.  HENT: Negative for nosebleeds, congestion, rhinorrhea, sneezing and postnasal drip.   Eyes: Negative for photophobia and visual disturbance.  Respiratory: Negative for apnea, cough, choking, chest tightness, shortness of breath and wheezing.   Cardiovascular: Negative for chest pain, palpitations and leg swelling.  Gastrointestinal: Negative for abdominal pain.  Genitourinary: Positive for frequency and flank pain.  Musculoskeletal: Negative for back pain and arthralgias.  Neurological: Positive for headaches. Negative for tremors, light-headedness and numbness.  Hematological: Negative for adenopathy.       Objective:   Physical Exam  Constitutional: She is oriented to person, place, and time. She appears well-developed and well-nourished. No distress.  HENT:  Head: Normocephalic  and atraumatic.  Mouth/Throat: No oropharyngeal exudate.  Eyes: Conjunctivae and EOM are normal. Pupils are equal, round, and reactive to light.  Neck: Normal range of motion. Neck supple. No JVD present. No tracheal deviation present. No thyromegaly present.  Cardiovascular: Normal rate, regular rhythm, normal heart sounds and intact distal pulses.  Exam reveals no gallop and no friction rub.   No murmur heard. Pulmonary/Chest: Effort normal and breath sounds normal. No stridor. No respiratory distress. She has no wheezes. She has no rales. She exhibits no tenderness.  Abdominal: Soft. Bowel sounds are normal. She exhibits no distension and no mass. There is no tenderness. There is no rebound and no guarding.  Musculoskeletal: Normal range of motion. She exhibits no edema and no tenderness.  Lymphadenopathy:    She has no cervical adenopathy.  Neurological: She is alert and oriented to person, place, and time. She has normal reflexes. She displays normal reflexes. No cranial nerve deficit. She exhibits normal muscle tone. Coordination normal.  Skin: Skin is warm. She is not diaphoretic.          Assessment & Plan:

## 2010-12-13 NOTE — Patient Instructions (Addendum)
Please schedule a follow up appointment in 2 month or earlier with your PCP. Please take your medicines as prescribed. Please call the clinic if your headache does not get better in 1 week.

## 2010-12-13 NOTE — Assessment & Plan Note (Signed)
  Blood pressure is stable. Continue current regimen. Lab Results  Component Value Date   NA 139 08/20/2010   K 4.0 08/20/2010   CL 99 08/20/2010   CO2 27 08/20/2010   BUN 10 08/20/2010   CREATININE 0.68 08/20/2010   CREATININE 0.69 06/05/2009    BP Readings from Last 3 Encounters:  12/13/10 130/84  08/20/10 129/78  05/09/10 131/85

## 2010-12-27 ENCOUNTER — Other Ambulatory Visit: Payer: Self-pay | Admitting: *Deleted

## 2010-12-27 ENCOUNTER — Other Ambulatory Visit: Payer: Self-pay | Admitting: Internal Medicine

## 2010-12-28 NOTE — Telephone Encounter (Signed)
Called pt w/ appt ... 9/19 @ 1045, she is agreeable

## 2011-01-16 ENCOUNTER — Encounter: Payer: Self-pay | Admitting: Internal Medicine

## 2011-01-29 ENCOUNTER — Other Ambulatory Visit: Payer: Self-pay | Admitting: Internal Medicine

## 2011-01-30 ENCOUNTER — Other Ambulatory Visit: Payer: Self-pay | Admitting: *Deleted

## 2011-01-30 MED ORDER — LISINOPRIL-HYDROCHLOROTHIAZIDE 20-25 MG PO TABS: 1.0000 | ORAL_TABLET | Freq: Every day | ORAL | Status: DC

## 2011-01-30 MED ORDER — HYDROCODONE-ACETAMINOPHEN 5-500 MG PO TABS: 1.0000 | ORAL_TABLET | Freq: Two times a day (BID) | ORAL | Status: DC

## 2011-01-30 NOTE — Telephone Encounter (Signed)
Vicodin rx called to [facility] pharmacy; message sent to front desk for an appt.

## 2011-01-30 NOTE — Telephone Encounter (Signed)
Last appt  12/13/10.

## 2011-01-30 NOTE — Telephone Encounter (Signed)
Pls sch appt with Dr Arvilla Market. Needs F/U and narc contract

## 2011-02-07 LAB — URINALYSIS, ROUTINE W REFLEX MICROSCOPIC
Bilirubin Urine: NEGATIVE
Ketones, ur: NEGATIVE
Nitrite: NEGATIVE
Protein, ur: NEGATIVE
Urobilinogen, UA: 0.2
pH: 6

## 2011-02-11 LAB — URINALYSIS, ROUTINE W REFLEX MICROSCOPIC
Bilirubin Urine: NEGATIVE
Glucose, UA: NEGATIVE
Hgb urine dipstick: NEGATIVE
Ketones, ur: NEGATIVE
Protein, ur: NEGATIVE
pH: 6

## 2011-02-11 LAB — DIFFERENTIAL
Basophils Relative: 1
Eosinophils Absolute: 0.3
Lymphs Abs: 2.7
Monocytes Relative: 6
Neutro Abs: 5.2
Neutrophils Relative %: 60

## 2011-02-11 LAB — CBC
HCT: 34.3 — ABNORMAL LOW
Hemoglobin: 11.5 — ABNORMAL LOW
MCHC: 33.7
MCV: 86.9
RBC: 3.95
RDW: 13.9

## 2011-02-11 LAB — COMPREHENSIVE METABOLIC PANEL
ALT: 22
BUN: 7
CO2: 26
Calcium: 9.2
GFR calc non Af Amer: >60
Glucose, Bld: 95
Sodium: 141
Total Protein: 7

## 2011-02-11 LAB — D-DIMER, QUANTITATIVE: D-Dimer, Quant: 0.41

## 2011-02-11 LAB — LIPASE, BLOOD: Lipase: 21

## 2011-03-18 ENCOUNTER — Ambulatory Visit (INDEPENDENT_AMBULATORY_CARE_PROVIDER_SITE_OTHER): Payer: Self-pay | Admitting: Internal Medicine

## 2011-03-18 ENCOUNTER — Encounter: Payer: Self-pay | Admitting: Internal Medicine

## 2011-03-18 ENCOUNTER — Telehealth: Payer: Self-pay | Admitting: *Deleted

## 2011-03-18 VITALS — BP 140/89 | HR 86 | Temp 98.4°F | Wt 341.4 lb

## 2011-03-18 DIAGNOSIS — E669 Obesity, unspecified: Secondary | ICD-10-CM

## 2011-03-18 DIAGNOSIS — R1013 Epigastric pain: Secondary | ICD-10-CM

## 2011-03-18 DIAGNOSIS — R109 Unspecified abdominal pain: Secondary | ICD-10-CM | POA: Insufficient documentation

## 2011-03-18 LAB — CBC WITH DIFFERENTIAL/PLATELET
Eosinophils Relative: 2 % (ref 0–5)
HCT: 39.5 % (ref 36.0–46.0)
Lymphocytes Relative: 26 % (ref 12–46)
Lymphs Abs: 3.1 10*3/uL (ref 0.7–4.0)
MCH: 29.2 pg (ref 26.0–34.0)
MCV: 91.6 fL (ref 78.0–100.0)
Monocytes Absolute: 0.7 10*3/uL (ref 0.1–1.0)
Monocytes Relative: 6 % (ref 3–12)
RBC: 4.31 MIL/uL (ref 3.87–5.11)
WBC: 11.7 10*3/uL — ABNORMAL HIGH (ref 4.0–10.5)

## 2011-03-18 MED ORDER — OMEPRAZOLE 20 MG PO CPDR: 20.0000 mg | DELAYED_RELEASE_CAPSULE | Freq: Two times a day (BID) | ORAL | Status: DC

## 2011-03-18 MED ORDER — TRAMADOL HCL 50 MG PO TABS: 50.0000 mg | ORAL_TABLET | Freq: Four times a day (QID) | ORAL | Status: DC | PRN

## 2011-03-18 NOTE — Assessment & Plan Note (Signed)
Gave her information about gastric bypass surgery as I explained that she is unlikely to lose 150- pounds by herself.

## 2011-03-18 NOTE — Progress Notes (Signed)
  Subjective:    Patient ID: Carrie Harvey, female    DOB: 03-27-1966, 45 y.o.   MRN: [id number]  HPI  Patient is a 45 year old morbidly obese female with past medical history as noted in the chart.  She is here today for followup of abdominal pain.  She states that she has had abdominal pain for a while but it has become worse. She was prescribed dicyclomine at her last office visit which is not working. Patient describes pain in the epigastric region, 8/10 intensity, persistent, exacerbated by food and drinking and relieved spontaneously. No radiation . No relieving factors. Patient does take high doses of NSAIDS. Patient takes meloxicam 15 mg a day and ibuprofen 200 mg tablet as needed for her knee pain.  No other complaints at this time.  Review of Systems  Constitutional: Negative for fever, activity change and appetite change.  HENT: Negative for sore throat.   Respiratory: Negative for cough and shortness of breath.   Cardiovascular: Negative for chest pain and leg swelling.  Gastrointestinal: Positive for abdominal pain. Negative for nausea, diarrhea, constipation, blood in stool, abdominal distention and anal bleeding.  Genitourinary: Negative for frequency, hematuria and difficulty urinating.  Neurological: Negative for dizziness and headaches.  Psychiatric/Behavioral: Negative for suicidal ideas and behavioral problems.       Objective:   Physical Exam  Constitutional: She is oriented to person, place, and time. She appears well-developed and well-nourished.  HENT:  Head: Normocephalic and atraumatic.  Eyes: Conjunctivae and EOM are normal. Pupils are equal, round, and reactive to light. No scleral icterus.  Neck: Normal range of motion. Neck supple. No JVD present. No thyromegaly present.  Cardiovascular: Normal rate, regular rhythm, normal heart sounds and intact distal pulses.  Exam reveals no gallop and no friction rub.   No murmur heard. Pulmonary/Chest: Effort normal  and breath sounds normal. No respiratory distress. She has no wheezes. She has no rales.  Abdominal: Soft. Bowel sounds are normal. She exhibits distension. She exhibits no mass. There is tenderness. There is no rebound and no guarding.       Patient had generalized tenderness which was most prominent in the epigastric region  Musculoskeletal: Normal range of motion. She exhibits no edema and no tenderness.  Lymphadenopathy:    She has no cervical adenopathy.  Neurological: She is alert and oriented to person, place, and time.  Psychiatric: She has a normal mood and affect. Her behavior is normal.          Assessment & Plan:

## 2011-03-18 NOTE — Assessment & Plan Note (Signed)
Given that patient has had pain for some time but it has become worse in the last 2 days is concerning for peptic ulcer disease. I will check lipase, CMET and CBC with diff to rule out biliary disease, pancreatitis as well. I will start her on protonix bid as my suspicion of PUD is very high and I want her to avoid meloxicam and ibuprofen completely. Follow up in Dec 7th with her PCP or earlier if she feels worse.

## 2011-03-18 NOTE — Patient Instructions (Signed)
Bariatric Surgery (Gastrointestinal Surgery for Severe Obesity) Severe obesity is a longstanding condition. It is difficult to treat through diet and exercise alone. Gastrointestinal surgery is the best option for people who are severely obese and cannot lose weight by traditional means, or who suffer from serious obesity-related health problems. The surgery promotes weight loss by decreasing the absorption of food and, in some operations, interrupting the digestive process. As in other treatments for obesity, the best results are achieved with healthy eating behaviors and regular physical activity.  People who may consider gastrointestinal surgery include those with a body mass index (BMI) above 40. This is about 100 pounds of overweight for men and 80 pounds for women. People with a BMI between 35 and 40 and who suffer from type 2 diabetes or life-threatening cardiopulmonary (heart and lung) problems, such as severe sleep apnea or obesity-related heart disease, may also be candidates for surgery. (To use the Body Mass Index chart. find your weight on the bottom of the graph. Go straight up from that point until you come to the line that matches your height. Then look to find your weight group). The idea of gastrointestinal surgery to control obesity grew out of results of operations for cancer or severe ulcers that removed large portions of the stomach or small intestine. Patients undergoing these procedures tended to lose weight after surgery. So some physicians began to use such operations to treat severe obesity. The first operation that was widely used for severe obesity was the intestinal bypass. This operation was first used 40 years ago. It produced weight loss by causing malabsorption. The idea was that patients could eat large amounts of food, which would be poorly digested or passed along too fast for the body to absorb many calories. The problem with this surgery was that it caused a loss of  essential nutrients. Also, its side effects were unpredictable and sometimes fatal. The original form of the intestinal bypass operation is no longer used. THE NORMAL DIGESTIVE PROCESS Normally, as food moves along the digestive tract, digestive juices and enzymes digest and absorb calories and nutrients. After we chew and swallow our food, it moves down the esophagus to the stomach. There a strong acid continues the digestive process. The stomach can hold about 3 pints of food at one time. When the stomach contents move to the first portion of the small intestine (duodenum ), bile and pancreatic juice speed up digestion. Most of the iron and calcium in the foods we eat is absorbed in the duodenum. The jejunum and ileum are the remaining two segments of the nearly 20 feet of small intestine. They complete the absorption of almost all calories and nutrients. The food particles that cannot be digested in the small intestine are stored in the large intestine until eliminated.  HOW DOES SURGERY PROMOTE WEIGHT LOSS? Gastrointestinal surgery for obesity is also called bariatric surgery. It alters the digestive process. The operations promote weight loss by closing off parts of the stomach. This will make it smaller. Operations that only reduce stomach size are known as "restrictive operations". They restrict the amount of food the stomach can hold. Some operations combine stomach restriction with a partial bypass of the small intestine. These procedures create a direct connection from the stomach to the lower segment of the small intestine. This causes bypassing portions of the digestive tract that absorb calories and nutrients. These are known as malabsorptive operations. WHAT ARE THE SURGICAL OPTIONS? There are several types of restrictive and   malabsorptive operations. Each one carries its own benefits and risks.  Restrictive Operations  Restrictive operations serve only to restrict food intake. They do not  interfere with the normal digestive process. To perform the surgery, doctors create a small pouch at the top of the stomach where food enters from the esophagus. At first, the pouch holds about 1 ounce of food. It later expands to 2-3 ounces. The lower outlet of the pouch usually has a diameter of only about  inch. This small outlet delays the emptying of food from the pouch and causes a feeling of fullness. As a result of this surgery, most people lose the ability to eat large amounts of food at one time. After an operation, the person usually can eat only  to 1 cup of food without discomfort or nausea. Also, food has to be well chewed. Restrictive operations for obesity include adjustable gastric banding (AGB) and vertical banded gastroplasty (VBG).   Adjustable gastric banding  In this procedure, a hollow band made of special material is placed around the stomach near its upper end. This creates a small pouch and a narrow passage into the larger remainder of the stomach. The band is then inflated with a salt solution. It can be tightened or loosened over time to change the size of the passage by increasing or decreasing the amount of salt solution.   The band is adjusted based on feelings of hunger and weight loss. Patients decide when they need an adjustment and come to their surgeons to evaluate this. The adjustment is done as an office visit. The band is fully reversible with a second surgery if the patient changes his/her mind. There is no cutting or re-routing of the intestine.   Vertical banded gastroplasty  VBG has been the most common restrictive operation for weight control. Both a band and staples are used to create a small stomach pouch. Vertical banded gastroplasty is based on the same principle of restriction as the band. But the stomach is surgically altered with the stapling. This treatment is not reversible.   Restrictive operations lead to weight loss in almost all patients. But they  are less successful than malabsorptive operations in achieving substantial, long-term weight loss. About 30 percent of those who undergo VBG achieve normal weight. About 80 percent achieve some degree of weight loss. Some patients regain weight. Others are unable to adjust their eating habits and fail to lose the desired weight. Successful results depend on the patient's willingness to adopt a long-term plan of healthy eating and regular physical activity.   A common risk of restrictive operations is vomiting. This is caused when the small stomach is overly stretched by food particles that have not been chewed well. Band slippage and saline leakage have been reported after AGB. Risks of VBG include wearing away of the band and breakdown of the staple line. In a small number of cases, stomach juices may leak into the abdomen. This requires an emergency operation. In less than 1 percent of all cases, infection or death from complications may occur.  Malabsorptive Operations  Malabsorptive operations are the most common gastrointestinal surgeries for weight loss. They restrict both food intake and the amount of calories and nutrients the body absorbs.   Roux-en-Y gastric bypass (RGB)  This operation is the most common and successful malabsorptive surgery. First, a small stomach pouch is created to restrict food intake. Next, a Y-shaped section of the small intestine is attached to the pouch. This allows food  to bypass the lower stomach, the first segment of the small intestine (duodenum), and the first portion of the jejunum (the second segment of the small intestine). This bypass reduces the amount of calories and nutrients the body absorbs.   Biliopancreatic diversion (BPD)  In this more complicated malabsorptive operation, portions of the stomach are removed. The small pouch that remains is connected directly to the final segment of the small intestine, completely bypassing the duodenum and the jejunum.  This procedure successfully promotes weight loss. But it is less frequently used than other types of surgery because of the high risk for nutritional deficiencies. A variation of BPD includes a "duodenal switch". This leaves a larger portion of the stomach intact, including the pyloric valve. This valve regulates the release of stomach contents into the small intestine. It also keeps a small part of the duodenum in the digestive pathway.   Malabsorptive operations produce more weight loss than restrictive operations. And they are more effective in reversing the health problems associated with severe obesity. Patients who have malabsorptive operations generally lose two-thirds of their excess weight within 2 years.   In addition to the risks of restrictive surgeries, malabsorptive operations also carry greater risk for nutritional deficiencies. This is because the procedure causes food to bypass the duodenum and jejunum. That is where most iron and calcium are absorbed. Menstruating women may develop anemia because not enough vitamin B12 and iron are absorbed. Decreased absorption of calcium may also bring on osteoporosis and metabolic bone disease. Patients are required to take nutritional supplements that usually prevent these deficiencies. Patients who have the biliopancreatic diversion surgery must also take fat-soluble (dissolved by fat) vitamins A, D, E, and K supplements.   RGB and BPD operations may also cause "dumping syndrome". This means that stomach contents move too rapidly through the small intestine. Symptoms include nausea, weakness, sweating, faintness, and sometimes diarrhea after eating. The duodenal switch operation keeps the pyloric valve intact. So it may reduce the likelihood of dumping syndrome.   The more extensive the bypass, the greater the risk is for complications and nutritional deficiencies. Patients with extensive bypasses of the normal digestive process require close  monitoring. They also need life-long use of special foods, supplements, and medications.  EXPLORE BENEFITS AND RISKS Surgery to produce weight loss is a serious undertaking. Anyone thinking about surgery should understand what the operation involves. Patients and physicians should carefully consider the following benefits and risks.  Benefits  Right after surgery, most patients lose weight quickly. They continue to lose for 18 to 24 months after the procedure. Most patients regain 5 to 10 percent of the weight they lost. But many maintain a long-term weight loss of about 100 pounds.   Surgery improves most obesity-related conditions. For example, in one study blood sugar levels of 83 percent of obese patients with diabetes returned to normal after surgery. Nearly all patients whose blood sugar levels did not return to normal were older. Or they had lived with diabetes for a long time.  Risks  Ten to 20 percent of patients who have weight-loss surgery require follow-up operations to correct complications. Abdominal hernia was the most common complication requiring follow-up surgery. But laparoscopic techniques seem to have solved this problem. In laparoscopy, the surgeon makes one or more small incisions. Slender surgical instruments are passed them. This technique eliminates the need for a large incision. And it creates less tissue damage. Patients who are super obese (greater than 350 pounds) or have had  previous abdominal surgery, may not be good candidates for laparoscopy. Less common complications include breakdown of the staple line and stretched stomach outlets.   Some obese patients who have weight-loss surgery develop gallstones. These are clumps of cholesterol and other matter that form in the gallbladder. During quick or substantial weight loss, one's risk of developing gallstones increases. Taking supplemental bile salts for the first 6 months after surgery can prevent them.   Nearly 30  percent of patients who have weight-loss surgery develop nutritional deficiencies. These include anemia, osteoporosis, and metabolic bone disease. These usually can be avoided if vitamin and mineral intakes are high enough.   Women of childbearing age should avoid pregnancy until their weight becomes stable. Quick weight loss and nutritional deficiencies can harm a growing fetus.   Other risks of restrictive surgeries include:   Band slippage.   Stomach prolapse.   Band erosion into the lumen of the stomach.   Port infection.   The main risk with malabsorption operations is life threatening. It is the risk of leak from any of the anastomosis. The more involved the operation, the more risk involved.   There is one other risk of having the surgery. If people do not follow a strict diet, they will stretch out their stomach pouches. Then they will not lose weight.  MEDICAL COSTS Gastrointestinal surgery costs vary. They depend on the procedure. Medical insurance coverage varies by state and insurance provider. If you are considering gastrointestinal surgery, contact your r egional Medicare or Medicaid office or your insurance plan. Find out from them if the procedure is covered. IS THE SURGERY FOR YOU?  Gastrointestinal surgery may be the next step for people who remain severely obese after trying nonsurgical approaches or have an obesity-related disease. Candidates for surgery have:   A BMI of 40 or more.   A BMI of 35 or more and a life-threatening obesity-related health problem such as:   Diabetes.   Severe sleep apnea.   Heart disease.   Obesity-related physical problems that interfere with:   Employment.   Walking.   Family function.  If you fit the profile for surgery, answers to these questions may help you decide whether weight-loss surgery is appropriate for you. Are you:  Unlikely to lose weight successfully without surgery?   Well informed about the surgical  procedure? The effects of treatment?   Determined to lose weight? Improve your health?   Aware of how your life may change after the operation? Adjustment to the side effects of the surgery include the need to chew well and being unable to eat large meals.   Aware of the potential for serious complications? Dietary restrictions? Occasional failures?   Committed to lifelong medical follow-up?   Restrictive operations are very successful with patients who follow a diet created by a dietician. Support groups and follow up with caregivers is important.  Remember: There are no guarantees for any method to produce and maintain weight loss. This includes surgery. Success is possible only with:  Maximum cooperation.   Commitment to behavioral change.   Medical follow-up.  This cooperation and commitment must be carried out for the rest of your life.  ADDITIONAL RESOURCES [employer] [address], [address] [place], [place] [address] www.asmbs.org  Weight[employer] Information [employer] (WIN) [address] [place] [address] [employer] Document Released: 04/15/2005 Document Revised: 12/26/2010 Document Reviewed: 07/09/2006 [facility] Patient Information 2012 [place], [place].

## 2011-03-18 NOTE — Telephone Encounter (Signed)
Pt called with c/o abd pain for 3 days. Usually meds will help. She has been taking dicyclomine prn without relief.   Will see today.

## 2011-03-19 LAB — COMPLETE METABOLIC PANEL WITH GFR
ALT: 13 U/L (ref 0–35)
AST: 10 U/L (ref 0–37)
Chloride: 100 mEq/L (ref 96–112)
Creat: 0.71 mg/dL (ref 0.50–1.10)
Sodium: 137 mEq/L (ref 135–145)
Total Bilirubin: 0.4 mg/dL (ref 0.3–1.2)
Total Protein: 7.2 g/dL (ref 6.0–8.3)

## 2011-04-05 ENCOUNTER — Encounter: Payer: Self-pay | Admitting: Internal Medicine

## 2011-04-08 ENCOUNTER — Telehealth: Payer: Self-pay | Admitting: *Deleted

## 2011-04-08 NOTE — Telephone Encounter (Signed)
Call from pt asking for a refill om her Vicodin 5/500 mg tablets # 120.  Message that she is hurting.  Wants prescription sent to the [facility].  Angelina Ok, RN 04/08/2011 8:42 AM

## 2011-04-08 NOTE — Telephone Encounter (Signed)
Pt was last prescribed narcotics in July 2012.  She need to be evaluated if she is experiencing significant pain and feels she needs vicodin. She had previously been doing well on tramadol. I will not submit a refill request today.  Please advise her to use OTC pain medications for pain relief and have her schedule an OV.  Thank you!

## 2011-04-11 ENCOUNTER — Ambulatory Visit: Payer: Self-pay | Admitting: Internal Medicine

## 2011-04-27 ENCOUNTER — Emergency Department (HOSPITAL_COMMUNITY)
Admission: EM | Admit: 2011-04-27 | Discharge: 2011-04-27 | Disposition: A | Discharge status: Home / Self Care | Payer: Self-pay | Attending: Emergency Medicine | Admitting: Emergency Medicine

## 2011-04-27 ENCOUNTER — Encounter (HOSPITAL_COMMUNITY): Payer: Self-pay | Admitting: Emergency Medicine

## 2011-04-27 DIAGNOSIS — R05 Cough: Secondary | ICD-10-CM | POA: Insufficient documentation

## 2011-04-27 DIAGNOSIS — N764 Abscess of vulva: Secondary | ICD-10-CM | POA: Insufficient documentation

## 2011-04-27 DIAGNOSIS — J3489 Other specified disorders of nose and nasal sinuses: Secondary | ICD-10-CM | POA: Insufficient documentation

## 2011-04-27 DIAGNOSIS — R059 Cough, unspecified: Secondary | ICD-10-CM | POA: Insufficient documentation

## 2011-04-27 DIAGNOSIS — J029 Acute pharyngitis, unspecified: Secondary | ICD-10-CM | POA: Insufficient documentation

## 2011-04-27 DIAGNOSIS — I10 Essential (primary) hypertension: Secondary | ICD-10-CM | POA: Insufficient documentation

## 2011-04-27 HISTORY — DX: Essential (primary) hypertension: I10

## 2011-04-27 HISTORY — DX: Furuncle, unspecified: L02.92

## 2011-04-27 MED ORDER — SULFAMETHOXAZOLE-TRIMETHOPRIM 800-160 MG PO TABS: 1.0000 | ORAL_TABLET | Freq: Two times a day (BID) | ORAL | Status: AC

## 2011-04-27 NOTE — ED Notes (Signed)
Pt reports boil on right labia noticed yesterday. Pt also reports dry cough x 2 days.

## 2011-04-27 NOTE — ED Provider Notes (Signed)
History     CSN: [id number]  Arrival date & time 04/27/11  1134   First MD Initiated Contact with Patient 04/27/11 1212      Chief Complaint  Patient presents with  . Recurrent Skin Infections    on right labia  . Cough    (Consider location/radiation/quality/duration/timing/severity/associated sxs/prior treatment) HPI Comments: Patient reports her husband noticed a knot on her right labia that he thought was a boil.  States the area is tender to the touch.  Denies fever, abdominal pain.  Pt notes unrelated cough, sore throat, nasal congestion x 2 days.  Sick contacts at home.  Denies SOB.    Patient is a 45 y.o. female presenting with cough. The history is provided by the patient.  Cough    Past Medical History  Diagnosis Date  . Boil   . Hypertension     History reviewed. No pertinent past surgical history.  History reviewed. No pertinent family history.  History  Substance Use Topics  . Smoking status: Never Smoker   . Smokeless tobacco: Not on file  . Alcohol Use: No    OB History    Grav Para Term Preterm Abortions TAB SAB Ect Mult Living                  Review of Systems  Respiratory: Positive for cough.   All other systems reviewed and are negative.    Allergies  Fish allergy and Iodine  Home Medications   Current Outpatient Rx  Name Route Sig Dispense Refill  . LISINOPRIL-HYDROCHLOROTHIAZIDE 20-25 MG PO TABS Oral Take 1 tablet by mouth daily. 30 tablet 11    BP 120/64  Pulse 75  Temp(Src) 98.4 F (36.9 C) (Oral)  Resp 20  SpO2 96%  LMP 04/06/2011  Physical Exam  Nursing note and vitals reviewed. Constitutional: She is oriented to person, place, and time. She appears well-developed and well-nourished.  HENT:  Head: Normocephalic and atraumatic.  Neck: Neck supple.  Cardiovascular: Normal rate, regular rhythm and normal heart sounds.   Pulmonary/Chest: Effort normal. No respiratory distress. She has no wheezes. She has no rales.  She exhibits no tenderness.  Abdominal: Soft. There is no tenderness. There is no rebound and no guarding.  Genitourinary:     Musculoskeletal: Normal range of motion.  Neurological: She is alert and oriented to person, place, and time.    ED Course  Procedures (including critical care time)  Labs Reviewed - No data to display No results found.   12:57 PM I attempted I&D of small right labial boil vs cyst.  Pt unable to tolerate lidocaine and declines further intervention.  Discussed home care and possible need for I&D in the future.  Pt verbalizes understanding.    1. Boil, labium       MDM  Patient with small area of induration within right labia majora.  Attempted I&D in ED but patient could not tolerate procedure and decided to decline all further intervention.  Pt d/c home with abx, instructions for warm compresses, return for worsening symptoms.          Rise Patience, [place] 04/27/11 1414

## 2011-04-27 NOTE — ED Notes (Signed)
Irving Burton, PA made aware that pt is ready for exam.

## 2011-04-27 NOTE — ED Provider Notes (Signed)
Medical screening examination/treatment/procedure(s) were performed by non-physician practitioner and as supervising physician I was immediately available for consultation/collaboration.  Sarajane Fambrough, MD 04/27/11 2047 

## 2011-05-02 ENCOUNTER — Encounter: Payer: Self-pay | Admitting: Internal Medicine

## 2011-05-31 ENCOUNTER — Encounter: Payer: Self-pay | Admitting: Internal Medicine

## 2011-06-29 ENCOUNTER — Other Ambulatory Visit: Payer: Self-pay | Admitting: Internal Medicine

## 2011-07-01 ENCOUNTER — Encounter: Payer: Self-pay | Admitting: Internal Medicine

## 2011-07-02 ENCOUNTER — Encounter: Payer: Self-pay | Admitting: Internal Medicine

## 2011-07-08 ENCOUNTER — Encounter: Payer: Self-pay | Admitting: Internal Medicine

## 2011-07-16 ENCOUNTER — Ambulatory Visit (INDEPENDENT_AMBULATORY_CARE_PROVIDER_SITE_OTHER): Payer: Self-pay | Admitting: Internal Medicine

## 2011-07-16 ENCOUNTER — Encounter: Payer: Self-pay | Admitting: Internal Medicine

## 2011-07-16 VITALS — BP 155/89 | HR 67 | Temp 97.3°F | Ht 61.5 in | Wt 333.6 lb

## 2011-07-16 DIAGNOSIS — Z0000 Encounter for general adult medical examination without abnormal findings: Secondary | ICD-10-CM

## 2011-07-16 DIAGNOSIS — M1990 Unspecified osteoarthritis, unspecified site: Secondary | ICD-10-CM

## 2011-07-16 DIAGNOSIS — R109 Unspecified abdominal pain: Secondary | ICD-10-CM

## 2011-07-16 DIAGNOSIS — I10 Essential (primary) hypertension: Secondary | ICD-10-CM

## 2011-07-16 DIAGNOSIS — M25562 Pain in left knee: Secondary | ICD-10-CM

## 2011-07-16 DIAGNOSIS — K219 Gastro-esophageal reflux disease without esophagitis: Secondary | ICD-10-CM

## 2011-07-16 DIAGNOSIS — M25569 Pain in unspecified knee: Secondary | ICD-10-CM

## 2011-07-16 LAB — COMPREHENSIVE METABOLIC PANEL
ALT: 12 U/L (ref 0–35)
AST: 16 U/L (ref 0–37)
CO2: 27 mEq/L (ref 19–32)
Calcium: 9.2 mg/dL (ref 8.4–10.5)
Chloride: 103 mEq/L (ref 96–112)
Sodium: 138 mEq/L (ref 135–145)
Total Bilirubin: 0.4 mg/dL (ref 0.3–1.2)
Total Protein: 7.1 g/dL (ref 6.0–8.3)

## 2011-07-16 LAB — LIPID PANEL
Cholesterol: 163 mg/dL (ref 0–200)
VLDL: 15 mg/dL (ref 0–40)

## 2011-07-16 MED ORDER — LISINOPRIL-HYDROCHLOROTHIAZIDE 20-25 MG PO TABS: 1.0000 | ORAL_TABLET | Freq: Every day | ORAL | Status: DC

## 2011-07-16 MED ORDER — RANITIDINE HCL 300 MG PO CAPS: 300.0000 mg | ORAL_CAPSULE | Freq: Two times a day (BID) | ORAL | Status: AC

## 2011-07-16 MED ORDER — DICLOFENAC SODIUM 50 MG PO TBEC: 50.0000 mg | DELAYED_RELEASE_TABLET | Freq: Two times a day (BID) | ORAL | Status: DC | PRN

## 2011-07-16 NOTE — Assessment & Plan Note (Signed)
Patient describes bilateral knee pain.  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 Patient is actively attempting to lose weight.  Her pain is not currently well controlled with over-the-counter Tylenol.  She reports Mobic caused GI irritation (however she was using this in addition to excessive amounts of ibuprofen) and tramadol did not alleviate any of her pain.  She is agreeable to a trial of diclofenac; advised patient to take with food and to avoid any other nonsteroidal anti-inflammatory agents while she takes diclofenac. She expresses her understanding and agreement with this plan. Advised her to continue attempting to lose weight. Offered referral to [facility] for medical nutrition management; patient declines. We'll revisit this with her. Will discuss possible referral to physical therapy at her next office visit as this is first line treatment for osteoarthritic pain

## 2011-07-16 NOTE — Progress Notes (Signed)
Subjective:     Patient ID: Carrie Harvey, female   DOB: [birth year], 46 y.o.   MRN: [id number]  HPI patient is a 46 year old female here today with complaint of leg pain.  Abdominal pain: pt describes "belly pain."  She describes cramping pain in her upper abdomen.  Bentyl did not help.   She notes her symptoms are worse after eating greasy food, dairy, spicy food, and EtOH.  She admits to associated diarrhea she describes as watery; reports an average 3-4.  Denies BRBPR or dark starry stool.  She admits to dry cough and intermittent indigestion.  She is only taking 2 tabs of ibuprofen daily.  Denies to nausea or vomiting.   HTN: pt has been out of her BP meds for 2 weeks.    Bilateral knee pain: pt continues to experience pain in her knees.  She has established degenerative changes on x-ray.  She is taking OTC tylenol with minimal relief.  She was unable to tolerate Mobic 2/2 GI irritation.  She also did not respond well to tramadol      Review of Systems  Constitutional: Negative for fever, chills, diaphoresis, activity change, appetite change, fatigue and unexpected weight change.  HENT: Negative for hearing loss, congestion and neck stiffness.   Eyes: Negative for photophobia, pain and visual disturbance.  Respiratory: Negative for cough, chest tightness, shortness of breath and wheezing.   Cardiovascular: Negative for chest pain and palpitations.  Gastrointestinal: Negative for abdominal pain, blood in stool and anal bleeding.  Genitourinary: Negative for dysuria, hematuria and difficulty urinating.  Musculoskeletal: Negative for joint swelling.  Neurological: Negative for dizziness, syncope, speech difficulty, weakness, numbness and headaches.      Objective:   Physical Exam VItal signs reviewed and stable. GEN: No apparent distress.  Alert and oriented x 3.  Pleasant, conversant, and cooperative to exam. HEENT: head is autraumatic and normocephalic.  Neck is supple without  palpable masses or lymphadenopathy.  No JVD or carotid bruits.  Vision intact.  EOMI.  PERRLA.  Sclerae anicteric.  Conjunctivae without pallor or injection. Mucous membranes are moist.  Oropharynx is without erythema, exudates, or other abnormal lesions.  Dentition is poor with numerous teeth missing. RESP:  Lungs are clear to ascultation bilaterally with good air movement.  No wheezes, ronchi, or rubs. CARDIOVASCULAR: regular rate, normal rhythm.  Clear S1, S2, no murmurs, gallops, or rubs. ABDOMEN: soft, non-tender, non-distended.  Bowels sounds present in all quadrants and normoactive.  No palpable masses. EXT: warm and dry.  Peripheral pulses equal, intact, and +2 globally.  No clubbing or cyanosis.  Trace edema in bilateral lower extremities. SKIN: warm and dry with normal turgor.  No rashes or abnormal lesions observed. NEURO: CN II-XII grossly intact.  Muscle strength +5/5 in bilateral upper and lower extremities.  Sensation is grossly intact.  No focal deficit.     Assessment/Plan:

## 2011-07-16 NOTE — Assessment & Plan Note (Signed)
Blood pressure not at goal today likely because patient has been out of her antihypertensives for 2 weeks. Will submit a refill for this today and have her follow up for BP check in 2 week.  We'll check a comprehensive metabolic panel today and repeat basic metabolic panel at her followup visit to assess electrolyte statusand renal function.

## 2011-07-16 NOTE — Assessment & Plan Note (Signed)
Patient describes epigastric pain worsened with spicy foods, caffeine and alcohol as well as a dry chronic cough. I believe his symptoms reflect underlying GERD. She is currently not on any H2 blocker or PPI. We'll begin with ranitidine twice a day for some currently. We'll follow her up in 2 weeks to assess response to this

## 2011-07-16 NOTE — Assessment & Plan Note (Signed)
Patient declines annual flu vaccination.  Will have her return in 2 weeks for annual pelvic exam and Pap.

## 2011-07-16 NOTE — Assessment & Plan Note (Signed)
Likely multifactorial.  I believe her current symptoms are the result of untreated GERD in addition to cramping pain with diarrhea elicited by intake of fatty/greasy foods status post cholecystectomy. Advised patient to avoid trigger foods including fried foods, dairy, spicy foods, and other foods high in fat.  Will start her on a trial of ranitidine and follow her up in approximately 2 weeks to evaluate her response to Zantac and dietary changes.

## 2011-07-16 NOTE — Patient Instructions (Signed)
Schedule followup appointment with Dr. Arvilla Market in 2 weeks for a blood pressure check and pelvic exam. I will call you if your lab work is abnormal. Your prescriptions were sent to your [facility]. Ranitidine is a medicine to help with your stomach.  Be sure to use as instructed and do not miss any doses. Diclofenac is a medicine to help with your pain.  Take with food.  Do not take any additional Motrin, ibuprofen, Aleve, Naprosyn, aspirin, Excedrin, or other NSAID medication while you take Diclofenac.  You can continue to take Tylenol for additional pain relief. Avoid foods that are high in fat like fried food and dairy.  Your body has a hard time digesting these types of foods after your gallbladder was removed and causes you to have crampy abdominal pain and diarrhea. Keep up the good work with weight loss!

## 2011-07-19 ENCOUNTER — Encounter: Payer: Self-pay | Admitting: Internal Medicine

## 2011-07-24 ENCOUNTER — Encounter: Payer: Self-pay | Admitting: Internal Medicine

## 2011-08-21 ENCOUNTER — Telehealth: Payer: Self-pay | Admitting: Internal Medicine

## 2011-08-21 NOTE — Telephone Encounter (Signed)
Patient called initially with complains about sever left leg pain which started from the bottom of her legs and went up all the way to the hip. She further noted that she was hardly able walk and this has been worsening since a couple of days but today was worse. She then noted that the pain moved from her leg up to her chest associated with SOB. She noted the pain last for 5- 10 min and then goes away but reoccurs with walking (since the pain in her feet would worsens with walking). She never experienced it prior to this. She noted that she knows that she is big and trying hard to loose weight .  She then was more concerned about her pain in her legs. She noted that she took the diclofenac as prescribed but the pain is not relieved. I advised her to take Acetaminophen but patient was reluctant noting that she can not take any "yellow" pill because it causes her stomach problem.   I advised further to be evaluated for chest pain and SOB in the ED if her symptoms worsens. She was reluctant to call the ambulance in the night but would like to be seen in the clinic. I again underlined the importance that she needs to be seen in the ED if her chest pain and SOB worsen.

## 2011-08-27 ENCOUNTER — Emergency Department (HOSPITAL_COMMUNITY)
Admission: EM | Admit: 2011-08-27 | Discharge: 2011-08-27 | Disposition: A | Discharge status: Hospice | Payer: Self-pay | Attending: Emergency Medicine | Admitting: Emergency Medicine

## 2011-08-27 ENCOUNTER — Encounter (HOSPITAL_COMMUNITY): Payer: Self-pay | Admitting: *Deleted

## 2011-08-27 DIAGNOSIS — K089 Disorder of teeth and supporting structures, unspecified: Secondary | ICD-10-CM | POA: Insufficient documentation

## 2011-08-27 DIAGNOSIS — K029 Dental caries, unspecified: Secondary | ICD-10-CM | POA: Insufficient documentation

## 2011-08-27 DIAGNOSIS — K0889 Other specified disorders of teeth and supporting structures: Secondary | ICD-10-CM

## 2011-08-27 DIAGNOSIS — I10 Essential (primary) hypertension: Secondary | ICD-10-CM | POA: Insufficient documentation

## 2011-08-27 HISTORY — DX: Unspecified osteoarthritis, unspecified site: M19.90

## 2011-08-27 MED ORDER — IBUPROFEN 800 MG PO TABS: 800.0000 mg | ORAL_TABLET | Freq: Three times a day (TID) | ORAL | Status: DC

## 2011-08-27 MED ORDER — PENICILLIN V POTASSIUM 500 MG PO TABS: 500.0000 mg | ORAL_TABLET | Freq: Three times a day (TID) | ORAL | Status: AC

## 2011-08-27 MED ORDER — HYDROCODONE-ACETAMINOPHEN 5-500 MG PO TABS: 1.0000 | ORAL_TABLET | Freq: Four times a day (QID) | ORAL | Status: DC | PRN

## 2011-08-27 MED ORDER — OXYCODONE-ACETAMINOPHEN 5-325 MG PO TABS
2.0000 | ORAL_TABLET | Freq: Once | ORAL | Status: AC
  Administered 2011-08-27: 2 via ORAL
  Filled 2011-08-27: qty 2

## 2011-08-27 NOTE — ED Notes (Signed)
Pt is here with right teeth and jaw pain for last 3 days and pt sts she has holes in teeth and orajel not helping.

## 2011-08-27 NOTE — Discharge Instructions (Signed)
Apply warm compresses to jaw throughout the day. Take antibiotic and completion. Take hydrocodone-acetaminophen as directed, as needed for pain but do not drive or operate machinery with pain medication use. Alternate with ibuprofen for additional pain relief. It is VERY important to call the follow up dentist today and explain that you are being referred from [facility]. Followup with a dentist is very important for ongoing evaluation and management of recurrent dental pain. However, return to emergency department for emergent changing or worsening symptoms.  Toothache Toothaches are usually caused by tooth decay (cavity). However, other causes of toothache include:  Gum disease.   Cracked tooth.   Cracked filling.   Injury.   Jaw problem (temporo mandibular joint or TMJ disorder).   Tooth abscess.   Root sensitivity.   Grinding.   Eruption problems.  Swelling and redness around a painful tooth often means you have a dental abscess. Pain medicine and antibiotics can help reduce symptoms, but you will need to see a dentist within the next few days to have your problem properly evaluated and treated. If tooth decay is the problem, you may need a filling or root canal to save your tooth. If the problem is more severe, your tooth may need to be pulled. SEEK IMMEDIATE MEDICAL CARE IF:  You cannot swallow.   You develop severe swelling, increased redness, or increased pain in your mouth or face.   You have a fever.   You cannot open your mouth adequately.  Document Released: 05/23/2004 Document Revised: 04/04/2011 Document Reviewed: 07/13/2009 [facility] Patient Information 2012 [place], [place].

## 2011-08-27 NOTE — ED Provider Notes (Signed)
Medical screening examination/treatment/procedure(s) were conducted as a shared visit with non-physician practitioner(s) and myself.  I personally evaluated the patient during the encounter   Nadalee Neiswender L Hildreth Robart, MD 08/27/11 1517 

## 2011-08-27 NOTE — ED Provider Notes (Signed)
History     CSN: [id number]  Arrival date & time 08/27/11  1317   First MD Initiated Contact with Patient 08/27/11 1426      Chief Complaint  Patient presents with  . Dental Pain    (Consider location/radiation/quality/duration/timing/severity/associated sxs/prior treatment) HPI  Patient presents to emergency department complaining of a three-day history of gradual onset right lower dental pain. Patient states she has history of some dental cavities with some associated pain in the past but it usually resolves on its own within a day. Patient does not have a regular dentist. Patient states pain is gradual onset and not improved with over-the-counter pain medications. Patient states pain radiates up towards her right ear. Patient states she's been using Orajel as well without relief of symptoms. Patient states pain is aggravated by chewing and cold air. She denies alleviating factors. Denies fevers, chills, difficulty swallowing or breathing.  Past Medical History  Diagnosis Date  . Boil   . Hypertension   . Arthritis     History reviewed. No pertinent past surgical history.  No family history on file.  History  Substance Use Topics  . Smoking status: Never Smoker   . Smokeless tobacco: Not on file  . Alcohol Use: Yes     sometimes    OB History    Grav Para Term Preterm Abortions TAB SAB Ect Mult Living                  Review of Systems  All other systems reviewed and are negative.    Allergies  Fish allergy and Iodine  Home Medications   Current Outpatient Rx  Name Route Sig Dispense Refill  . DICLOFENAC SODIUM 50 MG PO TBEC Oral Take 50 mg by mouth 2 (two) times daily as needed. Take with food as needed for pain.    . IBUPROFEN 200 MG PO TABS Oral Take 400 mg by mouth every 6 (six) hours as needed. As needed for toothache/earache.    Marland Kitchen LISINOPRIL-HYDROCHLOROTHIAZIDE 20-25 MG PO TABS Oral Take 1 tablet by mouth daily. 30 tablet 11  . RANITIDINE HCL 300 MG  PO CAPS Oral Take 1 capsule (300 mg total) by mouth 2 (two) times daily. 60 capsule 3  . HYDROCODONE-ACETAMINOPHEN 5-500 MG PO TABS Oral Take 1-2 tablets by mouth every 6 (six) hours as needed for pain. 15 tablet 0  . IBUPROFEN 800 MG PO TABS Oral Take 1 tablet (800 mg total) by mouth 3 (three) times daily. 21 tablet 0  . PENICILLIN V POTASSIUM 500 MG PO TABS Oral Take 1 tablet (500 mg total) by mouth 3 (three) times daily. 30 tablet 0    BP 131/53  Pulse 82  Temp(Src) 98 F (36.7 C) (Oral)  Resp 16  SpO2 100%  Physical Exam  Nursing note and vitals reviewed. Constitutional: She is oriented to person, place, and time. She appears well-developed and well-nourished.  HENT:  Head: Normocephalic and atraumatic.  Right Ear: External ear normal.  Left Ear: External ear normal.       Dental decay of right lower molars with TTP but no gingival mass, swelling or fluctuance.  TM's normal bilaterally.    Eyes: Conjunctivae are normal.  Neck: Normal range of motion. Neck supple.  Cardiovascular: Normal rate and regular rhythm.   Pulmonary/Chest: Effort normal and breath sounds normal.  Musculoskeletal: Normal range of motion.  Lymphadenopathy:    She has no cervical adenopathy.  Neurological: She is alert and oriented to person,  place, and time.  Skin: Skin is warm and dry.  Psychiatric: She has a normal mood and affect. Her behavior is normal.    ED Course  Procedures (including critical care time)  PO percocet   Labs Reviewed - No data to display No results found.   1. Pain, dental       MDM  Dental pain with associated dental caries but no signs or symptoms of dental abscess. Normal TMs bilaterally. Patient afebrile nontoxic-appearing. I spoke at length with the patient about the importance of following up with the dentist for ongoing evaluation and management of ongoing dental pain. She voiced her understanding but agreeable to plan.        Jenness Corner,  PA 08/27/11 1449  Jenness Corner, PA 08/27/11 1449

## 2011-08-28 ENCOUNTER — Encounter: Payer: Self-pay | Admitting: Internal Medicine

## 2011-09-06 ENCOUNTER — Encounter: Payer: Self-pay | Admitting: Internal Medicine

## 2011-09-06 ENCOUNTER — Ambulatory Visit (INDEPENDENT_AMBULATORY_CARE_PROVIDER_SITE_OTHER): Payer: Self-pay | Admitting: Internal Medicine

## 2011-09-06 VITALS — BP 142/82 | HR 72 | Temp 97.4°F | Ht 61.5 in | Wt 329.3 lb

## 2011-09-06 DIAGNOSIS — K029 Dental caries, unspecified: Secondary | ICD-10-CM | POA: Insufficient documentation

## 2011-09-06 DIAGNOSIS — Z0000 Encounter for general adult medical examination without abnormal findings: Secondary | ICD-10-CM

## 2011-09-06 DIAGNOSIS — M069 Rheumatoid arthritis, unspecified: Secondary | ICD-10-CM

## 2011-09-06 DIAGNOSIS — K219 Gastro-esophageal reflux disease without esophagitis: Secondary | ICD-10-CM

## 2011-09-06 DIAGNOSIS — K0889 Other specified disorders of teeth and supporting structures: Secondary | ICD-10-CM | POA: Insufficient documentation

## 2011-09-06 MED ORDER — IBUPROFEN 600 MG PO TABS: 600.0000 mg | ORAL_TABLET | Freq: Three times a day (TID) | ORAL | Status: AC | PRN

## 2011-09-06 NOTE — Progress Notes (Signed)
Patient ID: Carrie Harvey, female   DOB: 1965-09-10, 46 y.o.   MRN: [id number]  Subjective:    HPI patient is a 46 year old female here today for routine follow up.  Dental pain:  Pt reports right sided dental pain and swelling ~1 week ago.  She went to the ER and was given rx for PCN, vicodin, and ibuprofen.  She reports resolution of the swelling after PCN and states the pain is improved but not resolved.  She continues to have sensitivity when eating foods hot or cold as well as some pain with chewing on the right side.  She denies fevers, rigors, N/V/D, syncope, headache, visual changes, or neurological complaint.  HTN: pt tolerating meds well.  Bilateral knee pain: pt continues to experience pain in her knees.  She has established degenerative changes on x-ray.  She is taking OTC tylenol with minimal relief.  She was unable to tolerate Mobic 2/2 GI irritation and also cannot tolerate tramadol.  At her last OV, she was prescribed diclofenac and referral to PT discussed.  Pt notes diclofenac helped for knees.  She notes motrin rx'd for dental pain helped her knee pain.  She has lost 20lbs over the past few months; she is continuing to try to increase her exercise and improve diet.    Review of Systems  Constitutional: Negative for fever, chills, diaphoresis, activity change, appetite change, fatigue and unexpected weight change.  HENT: Negative for hearing loss, congestion and neck stiffness.   Eyes: Negative for photophobia, pain and visual disturbance.  Respiratory: Negative for cough, chest tightness, shortness of breath and wheezing.   Cardiovascular: Negative for chest pain and palpitations.  Gastrointestinal: Negative for abdominal pain, blood in stool and anal bleeding.  Genitourinary: Negative for dysuria, hematuria and difficulty urinating.  Musculoskeletal: Negative for joint swelling.  Neurological: Negative for dizziness, syncope, speech difficulty, weakness, numbness and  headaches.      Objective:   Physical Exam VItal signs reviewed and stable. GEN: No apparent distress.  Alert and oriented x 3.  Pleasant, conversant, and cooperative to exam. HEENT: head is autraumatic and normocephalic.  Neck is supple without palpable masses or lymphadenopathy.  No JVD or carotid bruits.  Vision intact.  EOMI.  PERRLA.  Sclerae anicteric.  Conjunctivae without pallor or injection. Mucous membranes are moist.  Oropharynx is without erythema, exudates, or other abnormal lesions.  Dentition is poor with numerous caries.  No active draining lesions. No palpable cervical, submandibular, submental, or pre-/postauricular lymphadenopathy.  No increased erythema or swelling along the jawline.  No evidence of abscess formation. RESP:  Lungs are clear to ascultation bilaterally with good air movement.  No wheezes, ronchi, or rubs. CARDIOVASCULAR: regular rate, normal rhythm.  Clear S1, S2, no murmurs, gallops, or rubs. ABDOMEN: soft, non-tender, non-distended.  Bowels sounds present in all quadrants and normoactive.  No palpable masses. EXT: warm and dry.  Peripheral pulses equal, intact, and +2 globally.  No clubbing or cyanosis.  Trace edema in bilateral lower extremities. SKIN: warm and dry with normal turgor.  No rashes or abnormal lesions observed. NEURO: CN II-XII grossly intact.  Muscle strength +5/5 in bilateral upper and lower extremities.  Sensation is grossly intact.  No focal deficit.     Assessment/Plan:

## 2011-09-06 NOTE — Assessment & Plan Note (Signed)
Patient declines annual flu vaccination again today. She agrees to return in 1-2 weeks for annual pelvic exam and Pap smear.

## 2011-09-06 NOTE — Patient Instructions (Signed)
Schedule a follow up appointment with Dr. Arvilla Market in 1-2 weeks for your annual pelvic exam and pap smear. Do not take Diclofenac with the Ibuprofen. Keep taking your medications as directed. Call the clinic at [phone] with any concerns or questions.

## 2011-09-06 NOTE — Assessment & Plan Note (Signed)
No evidence of abscess formation. Patient is afebrile hemodynamically stable. Her symptoms are improved after course of penicillin. She is advised to return to the clinic or go to the emergency room should she develop fever, shaking chills, dizziness, syncope other concerning complaint.  Will refer her to a dentist today; she may require teeth extraction.

## 2011-09-06 NOTE — Assessment & Plan Note (Signed)
Symptoms well controlled on twice a day Zantac. Patient denies any dark black bowel movements comparable per rectum, hemoptysis, dyspepsia, or dysphasia. She understands not to take diclofenac in addition to ibuprofen. She also understands she is to avoid any additional nonsteroidals while using ibuprofen.

## 2011-09-06 NOTE — Assessment & Plan Note (Signed)
Patient notes pain is well-controlled with ibuprofen. Will submit a refill for Relafen 600 mg 3 times a day when necessary today. She is advised to not use diclofenac or any other nonsteroidal anti-inflammatory agents will taking ibuprofen.

## 2011-09-12 ENCOUNTER — Encounter: Payer: Self-pay | Admitting: Internal Medicine

## 2011-09-16 ENCOUNTER — Telehealth: Payer: Self-pay | Admitting: *Deleted

## 2011-09-16 NOTE — Telephone Encounter (Signed)
Pt called requesting Rx for Vicodin because IBU is not helping and her teeth are killing her.  She is asking for this until we can get her in with [facility]. Pt # [id number]

## 2011-09-18 NOTE — Telephone Encounter (Signed)
Pt called again stating Dr Arvilla Market told her if IBU did not help she would call in something stronger.  Dental pain is keeping her up all night.  Pt is waiting to get into dental clinic. Pt # [id number]

## 2011-09-20 ENCOUNTER — Telehealth: Payer: Self-pay | Admitting: Internal Medicine

## 2011-09-20 MED ORDER — HYDROCODONE-ACETAMINOPHEN 5-325 MG PO TABS: 1.0000 | ORAL_TABLET | Freq: Three times a day (TID) | ORAL | Status: DC | PRN

## 2011-09-20 NOTE — Telephone Encounter (Signed)
This has been taken care of by Dr. Arvilla Market.

## 2011-09-20 NOTE — Telephone Encounter (Signed)
Left message that new rx for for pain medication will be sent to her pharmacy and that she should return my cal with any concerns or questions.  Rx called in to her pharmacy.

## 2011-10-08 ENCOUNTER — Telehealth: Payer: Self-pay | Admitting: *Deleted

## 2011-10-08 NOTE — Telephone Encounter (Signed)
Pt request vicodin for dental pain. Pain is keeping her up at night.  Her appointment is 7/2 with [facility]. She uses about 3 a day.  Pt # [id number]

## 2011-10-09 ENCOUNTER — Other Ambulatory Visit: Payer: Self-pay | Admitting: Internal Medicine

## 2011-10-09 MED ORDER — HYDROCODONE-ACETAMINOPHEN 5-325 MG PO TABS: 1.0000 | ORAL_TABLET | Freq: Three times a day (TID) | ORAL | Status: AC | PRN

## 2011-10-09 NOTE — Telephone Encounter (Signed)
Refill approved.  Please call in rx to her pharmacy; rx info should be routed to you.  Please page me if there are any problems.  Thanks!

## 2011-10-09 NOTE — Telephone Encounter (Signed)
Rx called in to pharmacy. 

## 2011-10-09 NOTE — Progress Notes (Signed)
Rx called to pharmacy

## 2011-10-13 ENCOUNTER — Telehealth: Payer: Self-pay | Admitting: Internal Medicine

## 2011-10-13 NOTE — Telephone Encounter (Signed)
Patient called for swelling and redness in the nose and below the eye area. Patient thinks this is an absent was like to have penicillin. Patient has poor dentition and is scheduled for a dental appointment in July. I informed the patient that she needs to be evaluated at urgent care or emergency room today for further management. I did not prescribe anything over the phone. Patient understood but would like to wait until tomorrow. She would like to be seen in the clinic. I again underlined the importance to be evaluated very soon since this could cause serious problems.

## 2011-11-05 ENCOUNTER — Emergency Department (HOSPITAL_COMMUNITY)
Admission: EM | Admit: 2011-11-05 | Discharge: 2011-11-05 | Disposition: A | Discharge status: Home / Self Care | Payer: Self-pay | Attending: Emergency Medicine | Admitting: Emergency Medicine

## 2011-11-05 ENCOUNTER — Encounter (HOSPITAL_COMMUNITY): Payer: Self-pay | Admitting: *Deleted

## 2011-11-05 DIAGNOSIS — I10 Essential (primary) hypertension: Secondary | ICD-10-CM | POA: Insufficient documentation

## 2011-11-05 DIAGNOSIS — S335XXA Sprain of ligaments of lumbar spine, initial encounter: Secondary | ICD-10-CM | POA: Insufficient documentation

## 2011-11-05 DIAGNOSIS — M129 Arthropathy, unspecified: Secondary | ICD-10-CM | POA: Insufficient documentation

## 2011-11-05 DIAGNOSIS — X500XXA Overexertion from strenuous movement or load, initial encounter: Secondary | ICD-10-CM | POA: Insufficient documentation

## 2011-11-05 DIAGNOSIS — S39012A Strain of muscle, fascia and tendon of lower back, initial encounter: Secondary | ICD-10-CM

## 2011-11-05 HISTORY — DX: Gastro-esophageal reflux disease without esophagitis: K21.9

## 2011-11-05 MED ORDER — METHOCARBAMOL 500 MG PO TABS: 1000.0000 mg | ORAL_TABLET | Freq: Three times a day (TID) | ORAL | Status: AC

## 2011-11-05 MED ORDER — DICLOFENAC SODIUM 75 MG PO TBEC: 75.0000 mg | DELAYED_RELEASE_TABLET | Freq: Two times a day (BID) | ORAL | Status: DC

## 2011-11-05 MED ORDER — METHOCARBAMOL 500 MG PO TABS
1000.0000 mg | ORAL_TABLET | Freq: Once | ORAL | Status: AC
  Administered 2011-11-05: 1000 mg via ORAL
  Filled 2011-11-05: qty 2

## 2011-11-05 MED ORDER — KETOROLAC TROMETHAMINE 60 MG/2ML IM SOLN
60.0000 mg | Freq: Once | INTRAMUSCULAR | Status: AC
  Administered 2011-11-05: 60 mg via INTRAMUSCULAR
  Filled 2011-11-05: qty 2

## 2011-11-05 NOTE — ED Notes (Signed)
Reports mid back pain for several days, thinks it may be related to moving recently, denies urinary symptoms.

## 2011-11-05 NOTE — ED Provider Notes (Signed)
History     CSN: [id number]  Arrival date & time 11/05/11  4098   First MD Initiated Contact with Patient 11/05/11 0920      No chief complaint on file.   (Consider location/radiation/quality/duration/timing/severity/associated sxs/prior treatment) HPI Comments: Patient presents with pain to her low back for the past 2-3 days. It has gradually been worsening. Patient reports that she moved recently and lifted heavy boxes. She has been taking ibuprofen and Tylenol without relief of her symptoms. She has additionally tried heat. No history of injury to the back. The pain does not radiate into her legs.  Patient is a 46 y.o. female presenting with back pain. The history is provided by the patient.  Back Pain  This is a new problem. The current episode started 2 days ago. The problem occurs constantly. The problem has been gradually worsening. The pain is associated with lifting heavy objects. The pain is present in the thoracic spine and lumbar spine. The quality of the pain is described as aching. The pain does not radiate. The pain is moderate. The symptoms are aggravated by bending, twisting and certain positions. The pain is the same all the time. Pertinent negatives include no chest pain, no fever, no abdominal pain, no abdominal swelling, no bowel incontinence, no perianal numbness, no bladder incontinence, no dysuria, no pelvic pain, no leg pain, no paresthesias, no paresis, no tingling and no weakness. She has tried NSAIDs for the symptoms. The treatment provided mild relief. Risk factors include obesity and lack of exercise.    Past Medical History  Diagnosis Date  . Boil   . Hypertension   . Arthritis     No past surgical history on file.  No family history on file.  History  Substance Use Topics  . Smoking status: Never Smoker   . Smokeless tobacco: Not on file  . Alcohol Use: Yes     sometimes    OB History    Grav Para Term Preterm Abortions TAB SAB Ect Mult Living                  Review of Systems  Constitutional: Negative for fever.  Cardiovascular: Negative for chest pain.  Gastrointestinal: Negative for abdominal pain and bowel incontinence.  Genitourinary: Negative for bladder incontinence, dysuria and pelvic pain.  Musculoskeletal: Positive for back pain.  Neurological: Negative for tingling, weakness and paresthesias.   review of systems as per history of present illness  Allergies  Fish allergy and Iodine  Home Medications   Current Outpatient Rx  Name Route Sig Dispense Refill  . DICLOFENAC SODIUM 50 MG PO TBEC Oral Take 50 mg by mouth 2 (two) times daily as needed. Take with food as needed for pain.    Marland Kitchen LISINOPRIL-HYDROCHLOROTHIAZIDE 20-25 MG PO TABS Oral Take 1 tablet by mouth daily. 30 tablet 11  . RANITIDINE HCL 300 MG PO CAPS Oral Take 1 capsule (300 mg total) by mouth 2 (two) times daily. 60 capsule 3    BP 170/105  Pulse 67  Temp 98.2 F (36.8 C) (Oral)  Resp 22  SpO2 98%  Physical Exam  Nursing note and vitals reviewed. Constitutional: She is oriented to person, place, and time. She appears well-developed and well-nourished. No distress.  HENT:  Head: Normocephalic and atraumatic.  Neck: Normal range of motion.  Cardiovascular: Normal rate.   Pulmonary/Chest: Effort normal.  Abdominal: Soft. There is no tenderness.  Musculoskeletal: Normal range of motion.       Lumbar  back: She exhibits tenderness. She exhibits no bony tenderness, no swelling and no edema.       Back:       Spine: no palp stepoff, crepitus, or deformity. Palpable paravertebral spasm on the right. Negative straight leg raise.  Neurological: She is alert and oriented to person, place, and time. She has normal strength. No sensory deficit. Gait normal.  Reflex Scores:      Patellar reflexes are 2+ on the right side and 2+ on the left side.      Achilles reflexes are 2+ on the right side and 2+ on the left side.      Normal gait, 5 out of 5  strength to the bilateral lower extremities on resisted knee and ankle flexion/extension  Skin: Skin is warm and dry. She is not diaphoretic.  Psychiatric: She has a normal mood and affect.    ED Course  Procedures (including critical care time)  Labs Reviewed - No data to display No results found.   1. Back strain       MDM  Patient presents with several days of back pain. Does not radiate. She reports recently lifting heavy boxes. Pain is very reproducible on exam and worsens with any movement. Palpable muscle spasm. Patient will be treated with NSAIDs and Robaxin. Instructed to followup with her PCP if not improving. Return precautions discussed.        Grant Fontana, PA-C 11/05/11 1009

## 2011-11-05 NOTE — ED Provider Notes (Signed)
Medical screening examination/treatment/procedure(s) were performed by non-physician practitioner and as supervising physician I was immediately available for consultation/collaboration.   Loren Racer, MD 11/05/11 1406

## 2011-11-06 ENCOUNTER — Telehealth: Payer: Self-pay | Admitting: Internal Medicine

## 2011-11-06 NOTE — Telephone Encounter (Signed)
Verified name and DOB. Patient called and was seen in ED yesterday for some back pain and her blood pressure was up to 170s/100 in ED. She was wondering if that could cause her to be urinating a lot. Advised her that her blood pressure medicine HCTZ/lisinopril could be causing some frequency but she may have UTI. She is not having any burning or pyuria. No fevers or chills or suprapubic tenderness. Advised her to get it checked out at urgent care and she advised that she would go in the morning. Warned her that she should go to the ED if she developed worsening pain, fevers, chills. Advised her to drink extra fluids until she comes to urgent care.   Dr. Dorise Hiss 11/06/2011 7:35 PM

## 2011-11-09 ENCOUNTER — Telehealth: Payer: Self-pay | Admitting: Internal Medicine

## 2011-11-09 NOTE — Telephone Encounter (Signed)
The patient called the hospital today (11/09/11, 8:20 pm), which was routed to my pager, and I returned the patient's call.  The patient notes a 1-week history of stomach pain, described as diffuse, 5/10 in severity, worse in the early morning and evenings, worse after eating.  She has a history of GERD.  She has not tried antacids for the pain.  She started taking ibuprofen about 5 days ago for back pain.   She also notes loose, watery stools twice per day for the last 3-4 days, typically occurring after eating a meal, with no blood/melena in the stool.  She notes no nausea/vomiting.  She asks for xanax for her diarrhea.  The patient also notes that her urine has been a "darker color" today, and that she has urinated 3 times over the course of the day today.  She notes no dysuria, malodorous urine, or urinary frequency/urgency.  I advised the patient that her abdominal pain is likely due to GERD, likely exacerbated by NSAID usage.  I advised OTC tums/maalox for pain, and discontinuing ibuprofen.  The patient notes no blood/melena in the stool.  The etiology of her loose stools is unclear, though the patient is only having 2 per day.  I advised her to continue to drink plenty of fluids, as her dark urine may be a sign of mild volume depletion.  I advised the patient to seek care with the Outpatient Clinic in 2 days when the clinic reopens, or with Urgent Care tomorrow.  If symptoms worsen, or the patient notes blood in the stool, I advised her to seek care in the ED.  The patient expressed understanding.

## 2011-12-06 ENCOUNTER — Encounter: Payer: Self-pay | Admitting: Internal Medicine

## 2011-12-06 ENCOUNTER — Ambulatory Visit (INDEPENDENT_AMBULATORY_CARE_PROVIDER_SITE_OTHER): Payer: Self-pay | Admitting: Internal Medicine

## 2011-12-06 ENCOUNTER — Telehealth: Payer: Self-pay | Admitting: *Deleted

## 2011-12-06 VITALS — BP 164/101 | HR 85 | Temp 98.6°F | Ht 62.0 in | Wt 323.9 lb

## 2011-12-06 DIAGNOSIS — R238 Other skin changes: Secondary | ICD-10-CM | POA: Insufficient documentation

## 2011-12-06 DIAGNOSIS — I10 Essential (primary) hypertension: Secondary | ICD-10-CM

## 2011-12-06 DIAGNOSIS — L988 Other specified disorders of the skin and subcutaneous tissue: Secondary | ICD-10-CM

## 2011-12-06 NOTE — Assessment & Plan Note (Signed)
Blood pressure moderately elevated today. On repeat, blood pressure was 163/107. Patient reports she is compliant with all medications. Blood pressure was also elevated when she was recently in the emergency department for low back pain in July. Prior to that blood pressures were relatively well controlled. She'll return in one week to recheck blood pressure. If blood pressure remains elevated, I will increase lisinopril to 40 mg, and continue hydrochlorothiazide 25 mg daily.

## 2011-12-06 NOTE — Telephone Encounter (Signed)
Pt calls and states x 2 daysshe has area under her L breast, kept her up last night, painful, no drainage, she thinks either an insect bite or boil. appt given for 1345 dr Everardo Beals today per chilonb.

## 2011-12-06 NOTE — Progress Notes (Signed)
Subjective:   Patient ID: Carrie Harvey Carrie Harvey female   DOB: 1965-05-20 46 y.o.   MRN: [id number]  HPI: [name] is a 46 y.o. woman with history of hypertension arthritis and reflux disease who presents today for an acute visit for a painful area under her left breast, ongoing for the last 2 days, that kept her up all night last night, painful, no drainage.  It is pruritic. She has never had anything like this on her breast before. She has had a perirectal abscess in the past, but had to be lanced and drained. She has not tried anything for this problem.   Past Medical History  Diagnosis Date  . Boil   . Hypertension   . Arthritis   . GERD (gastroesophageal reflux disease)    Current Outpatient Prescriptions  Medication Sig Dispense Refill  . acetaminophen (TYLENOL) 500 MG tablet Take 1,000 mg by mouth every 6 (six) hours as needed. For pain      . diclofenac (VOLTAREN) 75 MG EC tablet Take 1 tablet (75 mg total) by mouth 2 (two) times daily.  20 tablet  0  . ibuprofen (ADVIL,MOTRIN) 200 MG tablet Take 400 mg by mouth every 6 (six) hours as needed. Pain      . lisinopril-hydrochlorothiazide (PRINZIDE,ZESTORETIC) 20-25 MG per tablet Take 1 tablet by mouth daily.  30 tablet  11  . ranitidine (ZANTAC) 300 MG capsule Take 1 capsule (300 mg total) by mouth 2 (two) times daily.  60 capsule  3   No family history on file. History   Social History  . Marital Status: Married    Spouse Name: N/A    Number of Children: N/A  . Years of Education: N/A   Social History Main Topics  . Smoking status: Never Smoker   . Smokeless tobacco: Not on file  . Alcohol Use: Yes     sometimes  . Drug Use: No  . Sexually Active: Not Currently    Birth Control/ Protection: Surgical   Other Topics Concern  . Not on file   Social History Narrative   Financial assistance approved for 100% discount at [facility] and has [facility]ah [facility]  March 30, 2010 3:31 PM   Review of Systems: General: no  fevers, chills, changes in weight, changes in appetite HEENT: no blurry vision, hearing changes, sore throat Pulm: no dyspnea, coughing, wheezing CV: no chest pain, palpitations, shortness of breath Abd: no abdominal pain, nausea/vomiting, diarrhea/constipation GU: no dysuria, hematuria, polyuria Ext: no arthralgias, myalgias Neuro: no weakness, numbness, or tingling   Objective:  Physical Exam: Filed Vitals:   12/06/11 1418 12/06/11 1527  BP: 171/102 164/101  Pulse: 76 85  Temp: 98.6 F (37 C)   TempSrc: Oral   Height: 5\' 2"  (1.575 m)   Weight: 323 lb 14.4 oz (146.92 kg)    Constitutional: Vital signs reviewed.  Patient is an obese woman in no acute distress and cooperative with exam. Eyes: PERRL, EOMI, conjunctivae normal, No scleral icterus.  Cardiovascular: RRR, S1 normal, S2 normal, no MRG, pulses symmetric and intact bilaterally Pulmonary/Chest: CTAB, no wheezes, rales, or rhonchi Abdominal: Soft. Non-tender, non-distended, bowel sounds are normal, no masses, organomegaly, or guarding present.  Neurological: A&O x3, Strength is normal and symmetric bilaterally, cranial nerve II-XII are grossly intact, no focal motor deficit, sensory intact to light touch bilaterally.  Skin: 1 cm papule with 1-2 cm of surrounding induration without fluctuance on the inferior surface of left breast  Psychiatric: Normal  mood and affect. speech and behavior is normal. Judgment and thought content normal. Cognition and memory are normal.   Assessment & Plan:  Case and care discussed with Dr. Rogelia Boga. Patient to return in one week for blood pressure check and to followup regarding her breast lesion. Please see problem I am returning for further details.

## 2011-12-06 NOTE — Patient Instructions (Signed)
-  Apply warm compresses to the bump under your breast.  If it itches, you can try a topical benadryl, which you can get over the counter.  -Continue to take your blood pressure medicine regularly.  Please be sure to bring all of your medications with you to every visit.  Should you have any new or worsening symptoms, please be sure to call the clinic at [phone].

## 2011-12-06 NOTE — Assessment & Plan Note (Signed)
About a 1 cm papule with surrounding induration for about 1-2 cm. Maybe insect bite. Nothing to drain at this time. Very acute onset.  Warm compresses for 15-20 minutes 3-4 times a day. Return to clinic in one week

## 2011-12-16 ENCOUNTER — Encounter: Payer: Self-pay | Admitting: Internal Medicine

## 2011-12-23 ENCOUNTER — Encounter: Payer: Self-pay | Admitting: Internal Medicine

## 2011-12-23 ENCOUNTER — Ambulatory Visit (INDEPENDENT_AMBULATORY_CARE_PROVIDER_SITE_OTHER): Payer: Self-pay | Admitting: Internal Medicine

## 2011-12-23 VITALS — BP 120/80 | HR 80 | Temp 97.5°F | Resp 20 | Ht 60.75 in | Wt 321.1 lb

## 2011-12-23 DIAGNOSIS — L988 Other specified disorders of the skin and subcutaneous tissue: Secondary | ICD-10-CM

## 2011-12-23 DIAGNOSIS — R238 Other skin changes: Secondary | ICD-10-CM

## 2011-12-23 DIAGNOSIS — I10 Essential (primary) hypertension: Secondary | ICD-10-CM

## 2011-12-23 DIAGNOSIS — M549 Dorsalgia, unspecified: Secondary | ICD-10-CM

## 2011-12-23 MED ORDER — METHOCARBAMOL 500 MG PO TABS: 1000.0000 mg | ORAL_TABLET | Freq: Four times a day (QID) | ORAL | Status: AC

## 2011-12-23 NOTE — Assessment & Plan Note (Signed)
Lab Results  Component Value Date   NA 138 07/16/2011   K 4.3 07/16/2011   CL 103 07/16/2011   CO2 27 07/16/2011   BUN 11 07/16/2011   CREATININE 0.69 07/16/2011   CREATININE 0.69 06/05/2009    BP Readings from Last 3 Encounters:  12/23/11 120/80  12/06/11 164/101  11/05/11 170/105    Assessment: Hypertension control:  controlled  Progress toward goals:  at goal Barriers to meeting goals:  no barriers identified  Plan: Hypertension treatment:  continue current medications -lisinopril/HCTZ 20-25 mg daily

## 2011-12-23 NOTE — Assessment & Plan Note (Signed)
Resolved.  Likely d/t insect bite.

## 2011-12-23 NOTE — Progress Notes (Signed)
Subjective:   Patient ID: Carrie Harvey female   DOB: [birth year] 46 y.o.   MRN: [id number]  HPI: Carrie Harvey is a 46 y.o. woman with history of hypertension, arthritis, and morbid obesity, 46 presents today for blood pressure followup. She was last seen by me on 12/06/2011, during which visit her blood pressure was elevated. At that visit she was also in a lot of pain as she had an erythematous papule under her left breast. That has since resolved. She has been compliant with her blood pressure medication  Today she also complains of left low back pain. She reports that the pain is similar to July 2013 when she was seen in the emergency room for muscular strain. She notes that the pain started yesterday when she was moving a couch. She has tried ibuprofen, 2 tablets every 4 hours with minimal relief. She reports that the pain feels tense, it is 8/10 in severity. She reports that the pain is worse with movement, and better with pressure or support. When this happened in July, she reports that pain improved with muscle relaxer.  Past Medical History  Diagnosis Date  . Boil   . Hypertension   . Arthritis   . GERD (gastroesophageal reflux disease)    Current Outpatient Prescriptions  Medication Sig Dispense Refill  . ibuprofen (ADVIL,MOTRIN) 200 MG tablet Take 400 mg by mouth every 6 (six) hours as needed. Pain      . lisinopril-hydrochlorothiazide (PRINZIDE,ZESTORETIC) 20-25 MG per tablet Take 1 tablet by mouth daily.  30 tablet  11  . acetaminophen (TYLENOL) 500 MG tablet Take 1,000 mg by mouth every 6 (six) hours as needed. For pain      . methocarbamol (ROBAXIN) 500 MG tablet Take 2 tablets (1,000 mg total) by mouth 4 (four) times daily.  30 tablet  0  . ranitidine (ZANTAC) 300 MG capsule Take 1 capsule (300 mg total) by mouth 2 (two) times daily.  60 capsule  3   No family history on file. History   Social History  . Marital Status: Married    Spouse Name: N/A    Number of Children:  N/A  . Years of Education: N/A   Social History Main Topics  . Smoking status: Never Smoker   . Smokeless tobacco: None  . Alcohol Use: Yes     sometimes  . Drug Use: No  . Sexually Active: Not Currently    Birth Control/ Protection: Surgical   Other Topics Concern  . None   Social History Narrative   Financial assistance approved for 100% discount at [facility] and has [facility] cardDeborah [facility]  March 30, 2010 3:31 PM   Review of Systems: General: no fevers, chills, changes in weight, changes in appetite Skin: no rash HEENT: no blurry vision, hearing changes, sore throat Pulm: no dyspnea, coughing, wheezing CV: no chest pain, palpitations, shortness of breath Abd: no abdominal pain, nausea/vomiting, diarrhea/constipation GU: no dysuria, hematuria, polyuria Ext: Chronic bilateral knee pain, that has been better with weight loss Neuro: no weakness, numbness, or tingling   Objective:  Physical Exam: Filed Vitals:   12/23/11 0928  BP: 120/80  Pulse: 80  Temp: 97.5 F (36.4 C)  TempSrc: Oral  Resp: 20  Height: 5' 0.75" (1.543 m)  Weight: 321 lb 1.6 oz (145.65 kg)  SpO2: 98%   Constitutional: Vital signs reviewed.  Patient is an obese woman in no acute distress and cooperative with exam.  Mouth: no erythema or exudates, MMM Eyes:  PERRL, EOMI, conjunctivae normal, No scleral icterus.  Cardiovascular: RRR, S1 normal, S2 normal, no MRG, pulses symmetric and intact bilaterally Pulmonary/Chest: CTAB, no wheezes, rales, or rhonchi Abdominal: Soft. Non-tender, non-distended, bowel sounds are normal, no masses, organomegaly, or guarding present.  Musculoskeletal: Tense, contracted muscle of left lumbar region above the iliac crest that is tender to palpation  Neurological: A&O x3, Strength is normal and symmetric bilaterally, cranial nerve II-XII are grossly intact, no focal motor deficit, sensory intact to light touch bilaterally.  Skin: Warm, dry and intact. No rash, cyanosis, or  clubbing.  Psychiatric: Normal mood and affect. speech and behavior is normal. Judgment and thought content normal. Cognition and memory are normal.   Assessment & Plan:   Case and care discussed with Dr. Josem Kaufmann. Patient to return in 3 months for blood pressure followup, she will also likely require blood work (bmet) as well as Pap smear.

## 2011-12-23 NOTE — Assessment & Plan Note (Signed)
Likely muscular strain.  -Continue alternating acetaminophen and ibuprofen -Robaxin 1000 mg 4 times daily as needed #30

## 2011-12-23 NOTE — Patient Instructions (Signed)
-  I have sent in a prescription of Robaxin. He may take this for your muscle spasm -do not take for more than 4-5 days  -You may alternate between ibuprofen and Tylenol for your pain as well  -Your blood pressure looks great today!  Please be sure to bring all of your medications with you to every visit.  Should you have any new or worsening symptoms, please be sure to call the clinic at [phone].

## 2011-12-31 ENCOUNTER — Other Ambulatory Visit: Payer: Self-pay | Admitting: Internal Medicine

## 2011-12-31 ENCOUNTER — Other Ambulatory Visit: Payer: Self-pay | Admitting: *Deleted

## 2011-12-31 MED ORDER — DICYCLOMINE HCL 20 MG PO TABS: 20.0000 mg | ORAL_TABLET | Freq: Three times a day (TID) | ORAL | Status: DC | PRN

## 2011-12-31 NOTE — Telephone Encounter (Signed)
Order sent electronically to [facility] pharmacy for 1 month worth of med. Pt has taken medication before with questionable relief. Please call pt and inform her that if she continues to have abdominal pain/discomfort, she should call the clinic and make an appt to follow-up.

## 2011-12-31 NOTE — Telephone Encounter (Signed)
Thank you :)

## 2011-12-31 NOTE — Telephone Encounter (Signed)
Pt request refill on dicyclomine 20 mg  # 28

## 2012-01-03 NOTE — Telephone Encounter (Signed)
Pt informed and will call next week if not better.

## 2012-02-22 ENCOUNTER — Telehealth: Payer: Self-pay | Admitting: Internal Medicine

## 2012-02-22 NOTE — Telephone Encounter (Signed)
Patient called 8:45 PM and reported that she might be pregnant. Patient reports that her menstrual used to be regular, but since September 2, she did not have menstrual period. At the same time, she feels nauseated and vomited a few times in the past several days. Patient does not have abdominal pain, vaginal bleeding or diarrhea. She denies fever or chills. She also reported that she has not been taking her blood pressure medications for 2 months. She does not know her current blood pressure. Currently she feels OK and does not feel she needs urgent care now.  I suggested her to call clinic in the early morning of next Monday for an appointment. I also suggested her to not resume her Prinzide due to possible pregnancy. If she wants, she can get a simple pregnancy test from [facility] in the weekend. Patient is instructed to come to the ED if she develops abdominal pain or vaginal bleeding immediately. She verbally understood and agreed to do so.   Lorretta Harp, MD PGY2, Internal Medicine Teaching Service Pager: [phone]

## 2012-02-24 ENCOUNTER — Encounter (HOSPITAL_COMMUNITY): Payer: Self-pay | Admitting: *Deleted

## 2012-02-24 ENCOUNTER — Emergency Department (INDEPENDENT_AMBULATORY_CARE_PROVIDER_SITE_OTHER)
Admission: EM | Admit: 2012-02-24 | Discharge: 2012-02-24 | Disposition: A | Discharge status: Home / Self Care | Payer: Self-pay | Source: Home / Self Care

## 2012-02-24 DIAGNOSIS — E669 Obesity, unspecified: Secondary | ICD-10-CM

## 2012-02-24 DIAGNOSIS — R51 Headache: Secondary | ICD-10-CM

## 2012-02-24 DIAGNOSIS — I10 Essential (primary) hypertension: Secondary | ICD-10-CM

## 2012-02-24 DIAGNOSIS — R110 Nausea: Secondary | ICD-10-CM

## 2012-02-24 LAB — POCT PREGNANCY, URINE: Preg Test, Ur: NEGATIVE

## 2012-02-24 MED ORDER — KETOROLAC TROMETHAMINE 60 MG/2ML IM SOLN
INTRAMUSCULAR | Status: AC
  Filled 2012-02-24: qty 2

## 2012-02-24 MED ORDER — ONDANSETRON HCL 4 MG PO TABS: 4.0000 mg | ORAL_TABLET | Freq: Four times a day (QID) | ORAL | Status: DC

## 2012-02-24 MED ORDER — KETOROLAC TROMETHAMINE 60 MG/2ML IM SOLN
60.0000 mg | Freq: Once | INTRAMUSCULAR | Status: AC
  Administered 2012-02-24: 60 mg via INTRAMUSCULAR

## 2012-02-24 NOTE — ED Notes (Signed)
Pt  Did  Not  Take  Her  bp  meds  Today

## 2012-02-24 NOTE — ED Provider Notes (Signed)
History     CSN: [id number]  Arrival date & time 02/24/12  1426   None     Chief Complaint  Patient presents with  . Headache    (Consider location/radiation/quality/duration/timing/severity/associated sxs/prior treatment) HPI Comments: 46 year old female presents with a headache for 5 days. She has a history of hypertension and is currently on Zestoretic. However she has not had her sister red for proximally 6 days. She does have refills but just has not gotten her medicines refilled. She denies problems with vision speech hearing or swallowing. She does have nausea in the past 2 or 3 mornings. No vomiting. Denies focal weakness or paresthesias. She also has a concern about losing her last period. LMP was September 2 with only one scant episode of flow. She believes she may be entering menopause.   Past Medical History  Diagnosis Date  . Boil   . Hypertension   . Arthritis   . GERD (gastroesophageal reflux disease)     Past Surgical History  Procedure Date  . Tubal ligation     No family history on file.  History  Substance Use Topics  . Smoking status: Never Smoker   . Smokeless tobacco: Not on file  . Alcohol Use: Yes     sometimes    OB History    Grav Para Term Preterm Abortions TAB SAB Ect Mult Living                  Review of Systems  Constitutional: Negative for fever, activity change and fatigue.  HENT: Negative for hearing loss, facial swelling, rhinorrhea, trouble swallowing, neck pain, neck stiffness and postnasal drip.   Respiratory: Negative for cough, chest tightness, shortness of breath and wheezing.   Cardiovascular: Negative for chest pain and palpitations.  Gastrointestinal: Positive for nausea. Negative for vomiting and abdominal pain.  Genitourinary: Positive for menstrual problem.  Musculoskeletal: Negative.   Skin: Negative for color change, pallor and rash.  Neurological: Positive for headaches. Negative for dizziness, seizures,  syncope, facial asymmetry, speech difficulty, light-headedness and numbness.    Allergies  Fish allergy and Iodine  Home Medications   Current Outpatient Rx  Name Route Sig Dispense Refill  . ACETAMINOPHEN 500 MG PO TABS Oral Take 1,000 mg by mouth every 6 (six) hours as needed. For pain    . DICYCLOMINE HCL 20 MG PO TABS Oral Take 1 tablet (20 mg total) by mouth 3 (three) times daily as needed. 30 tablet 0  . IBUPROFEN 200 MG PO TABS Oral Take 400 mg by mouth every 6 (six) hours as needed. Pain    . LISINOPRIL-HYDROCHLOROTHIAZIDE 20-25 MG PO TABS Oral Take 1 tablet by mouth daily. 30 tablet 11  . ONDANSETRON HCL 4 MG PO TABS Oral Take 1 tablet (4 mg total) by mouth every 6 (six) hours. 12 tablet 0  . RANITIDINE HCL 300 MG PO CAPS Oral Take 1 capsule (300 mg total) by mouth 2 (two) times daily. 60 capsule 3    BP 175/108  Pulse 83  Temp 98.1 F (36.7 C) (Oral)  Resp 22  SpO2 100%  LMP 12/29/2011  Physical Exam  Constitutional: She is oriented to person, place, and time. She appears well-developed and well-nourished. No distress.       Morbidly obese  HENT:  Right Ear: External ear normal.  Left Ear: External ear normal.       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  Eyes: EOM are normal.  Neck: Normal range of motion. Neck supple.  Cardiovascular: Normal rate, regular rhythm and normal heart sounds.   Pulmonary/Chest: Effort normal and breath sounds normal. No respiratory distress. She has no wheezes.  Musculoskeletal: Normal range of motion.  Lymphadenopathy:    She has no cervical adenopathy.  Neurological: She is alert and oriented to person, place, and time. No cranial nerve deficit. She exhibits normal muscle tone.  Skin: Skin is warm and dry.  Psychiatric: She has a normal mood and affect.    ED Course  Procedures (including critical care time)   Labs Reviewed  POCT PREGNANCY, URINE   No results found.   1. Headache     2. HTN (hypertension)   3. Obesity   4. Nausea       MDM  Take one half of your Zestoretic tonight. Take 1 daily as directed, Blood pressure medication regimen is not getting her to goal and she may benefit from addition of a beta blocker. This may also help her with her headaches. This is something she may discuss with her PCP. I'm not sure what is causing her nausea may be from  Gastritis, as it has increased in the general population. She was given a prescription for Zofran 4 mg every 6 hours when necessary nausea. Worsening new symptoms problems increased headache problems with vision speech hearing swallowing, weakness, and she is to go to the emergency department Results for orders placed during the hospital encounter of 02/24/12  POCT PREGNANCY, URINE      Component Value Range   Preg Test, Ur NEGATIVE  NEGATIVE              Hayden Rasmussen, NP 02/24/12 1733

## 2012-02-24 NOTE — ED Notes (Signed)
Pt  Reports  Symptoms  Of  Headache       With  Nausea         Not  releived  By  otc  meds            She  Also  Reports    Late  On  Her     Period          -  Symptoms  Of the  Headaches  Began  Several weeks  Ago      -  Pt  Reports       Had  A  Small  Period  In Sept

## 2012-02-24 NOTE — ED Notes (Signed)
Patient not in exam room unable to obtain urine sample for pregnancy test.

## 2012-02-25 ENCOUNTER — Encounter: Payer: Self-pay | Admitting: Internal Medicine

## 2012-02-25 ENCOUNTER — Other Ambulatory Visit: Payer: Self-pay | Admitting: Internal Medicine

## 2012-02-25 MED ORDER — DICYCLOMINE HCL 20 MG PO TABS: 20.0000 mg | ORAL_TABLET | Freq: Three times a day (TID) | ORAL | Status: DC | PRN

## 2012-02-25 NOTE — ED Provider Notes (Signed)
Medical screening examination/treatment/procedure(s) were performed by resident physician or non-physician practitioner and as supervising physician I was immediately available for consultation/collaboration.   Khriz Liddy DOUGLAS MD.    Whitney Hillegass D Brett Soza, MD 02/25/12 0908 

## 2012-02-25 NOTE — Telephone Encounter (Signed)
Has nov appt as requested with PCP

## 2012-03-10 ENCOUNTER — Encounter: Payer: Self-pay | Admitting: Internal Medicine

## 2012-04-07 ENCOUNTER — Encounter: Payer: Self-pay | Admitting: Internal Medicine

## 2012-06-13 ENCOUNTER — Other Ambulatory Visit: Payer: Self-pay

## 2012-07-20 ENCOUNTER — Telehealth: Payer: Self-pay | Admitting: Internal Medicine

## 2012-07-20 NOTE — Telephone Encounter (Signed)
Patient was called this morning about scheduling her HM visit.  She is currently in [place] looking for employment.  She was not 100% sure if that would be her permanent residence or if she would be returning to [place], [place].  Asked the patient to give [place] a call back if she does return to schedule an appt.

## 2012-07-20 NOTE — Telephone Encounter (Signed)
Thank you :)

## 2012-08-05 ENCOUNTER — Encounter (HOSPITAL_COMMUNITY): Payer: Self-pay | Admitting: Emergency Medicine

## 2012-08-05 ENCOUNTER — Emergency Department (HOSPITAL_COMMUNITY)
Admission: EM | Admit: 2012-08-05 | Discharge: 2012-08-05 | Disposition: A | Discharge status: Home Health | Payer: No Typology Code available for payment source | Attending: Emergency Medicine | Admitting: Emergency Medicine

## 2012-08-05 ENCOUNTER — Emergency Department (HOSPITAL_COMMUNITY)
Admission: EM | Admit: 2012-08-05 | Discharge: 2012-08-05 | Disposition: A | Discharge status: Home / Self Care | Payer: No Typology Code available for payment source | Attending: Emergency Medicine | Admitting: Emergency Medicine

## 2012-08-05 DIAGNOSIS — Z8739 Personal history of other diseases of the musculoskeletal system and connective tissue: Secondary | ICD-10-CM | POA: Insufficient documentation

## 2012-08-05 DIAGNOSIS — M545 Low back pain, unspecified: Secondary | ICD-10-CM | POA: Insufficient documentation

## 2012-08-05 DIAGNOSIS — Z8719 Personal history of other diseases of the digestive system: Secondary | ICD-10-CM | POA: Insufficient documentation

## 2012-08-05 DIAGNOSIS — I10 Essential (primary) hypertension: Secondary | ICD-10-CM | POA: Insufficient documentation

## 2012-08-05 DIAGNOSIS — Z872 Personal history of diseases of the skin and subcutaneous tissue: Secondary | ICD-10-CM | POA: Insufficient documentation

## 2012-08-05 DIAGNOSIS — Z79899 Other long term (current) drug therapy: Secondary | ICD-10-CM | POA: Insufficient documentation

## 2012-08-05 DIAGNOSIS — M129 Arthropathy, unspecified: Secondary | ICD-10-CM | POA: Insufficient documentation

## 2012-08-05 DIAGNOSIS — T7840XA Allergy, unspecified, initial encounter: Secondary | ICD-10-CM | POA: Insufficient documentation

## 2012-08-05 DIAGNOSIS — E669 Obesity, unspecified: Secondary | ICD-10-CM | POA: Insufficient documentation

## 2012-08-05 DIAGNOSIS — R21 Rash and other nonspecific skin eruption: Secondary | ICD-10-CM | POA: Insufficient documentation

## 2012-08-05 DIAGNOSIS — T40605A Adverse effect of unspecified narcotics, initial encounter: Secondary | ICD-10-CM | POA: Insufficient documentation

## 2012-08-05 HISTORY — DX: Obesity, unspecified: E66.9

## 2012-08-05 HISTORY — DX: Pain in unspecified knee: M25.569

## 2012-08-05 MED ORDER — HYDROCODONE-ACETAMINOPHEN 5-325 MG PO TABS: 1.0000 | ORAL_TABLET | ORAL | Status: DC | PRN

## 2012-08-05 MED ORDER — DIPHENHYDRAMINE HCL 25 MG PO CAPS
25.0000 mg | ORAL_CAPSULE | Freq: Once | ORAL | Status: AC
  Administered 2012-08-05: 25 mg via ORAL
  Filled 2012-08-05: qty 1

## 2012-08-05 MED ORDER — HYDROCODONE-ACETAMINOPHEN 5-325 MG PO TABS
2.0000 | ORAL_TABLET | Freq: Once | ORAL | Status: AC
  Administered 2012-08-05: 2 via ORAL
  Filled 2012-08-05: qty 2

## 2012-08-05 NOTE — ED Notes (Addendum)
Pt received 2 Norco tablets at 0128 this morning at this ED for back pain. Pt denies back pain at this time. Pt c/o itchiness on BUE, states has never had a rxn like this before. Denies issues on any other areas of the skin. Bilateral forearms are reddened, no raised bumps at this time. Pt denies NV, SOB, or pain

## 2012-08-05 NOTE — ED Notes (Signed)
PT comfortable with discharge instructions

## 2012-08-05 NOTE — ED Notes (Signed)
PT. REPORTS LOW BACK PAIN FOR 3 DAYS DENIES INJURY OR FALL , AMBULATORY , NO DYSURIA OR HEMATURIA.

## 2012-08-05 NOTE — ED Provider Notes (Signed)
History     CSN: [id number]  Arrival date & time 08/05/12  0004   First MD Initiated Contact with Patient 08/05/12 0054      Chief Complaint  Patient presents with  . Back Pain   HPI  History provided by the patient. Patient is a 47 year old female with morbid obesity, hypertension and GERD who presents with complaints of lower back pain. Symptoms have been worsening for the past 3-4 days. Pain is mostly on the right lower back without significant radiation. Pain is sharp and persistent throughout the day. Patient does also report some chronic right knee pains for which she takes naproxen. This has not helped her back pains. Symptoms are worse with certain movements and walking. Denies any other aggravating or alleviating factors. Denies any pain down his lower extremities. No weakness or numbness in lower cherries. No urinary fecal incontinence, urinary tension or perineal numbness. No recent fever, chills or sweats. No weight changes. Denies any injury or trauma.    Past Medical History  Diagnosis Date  . Boil   . Hypertension   . Arthritis   . GERD (gastroesophageal reflux disease)   . Recurrent knee pain   . Obesity     Past Surgical History  Procedure Laterality Date  . Tubal ligation    . Cholecystectomy    . Cesarean section      No family history on file.  History  Substance Use Topics  . Smoking status: Never Smoker   . Smokeless tobacco: Not on file  . Alcohol Use: Yes     Comment: sometimes    OB History   Grav Para Term Preterm Abortions TAB SAB Ect Mult Living                  Review of Systems  Constitutional: Negative for unexpected weight change.  Neurological: Negative for weakness and numbness.  All other systems reviewed and are negative.    Allergies  Fish allergy and Iodine  Home Medications   Current Outpatient Rx  Name  Route  Sig  Dispense  Refill  . acetaminophen (TYLENOL) 500 MG tablet   Oral   Take 1,000 mg by mouth every 6  (six) hours as needed. For pain         . dicyclomine (BENTYL) 20 MG tablet   Oral   Take 1 tablet (20 mg total) by mouth 3 (three) times daily as needed.   30 tablet   5   . ibuprofen (ADVIL,MOTRIN) 200 MG tablet   Oral   Take 400 mg by mouth every 6 (six) hours as needed. Pain         . lisinopril-hydrochlorothiazide (PRINZIDE,ZESTORETIC) 20-25 MG per tablet   Oral   Take 1 tablet by mouth daily.   30 tablet   11   . ondansetron (ZOFRAN) 4 MG tablet   Oral   Take 1 tablet (4 mg total) by mouth every 6 (six) hours.   12 tablet   0     BP 135/90  Pulse 86  Temp(Src) 97.8 F (36.6 C) (Oral)  Resp 14  SpO2 96%  LMP 07/03/2012  Physical Exam  Nursing note and vitals reviewed. Constitutional: She is oriented to person, place, and time. She appears well-developed and well-nourished. No distress.  Morbidly obese  HENT:  Head: Normocephalic.  Cardiovascular: Normal rate and regular rhythm.   Pulmonary/Chest: Effort normal and breath sounds normal.  Abdominal: Soft.  No CVA tenderness  Musculoskeletal: Normal range  of motion. She exhibits no edema and no tenderness.       Cervical back: Normal.       Thoracic back: Normal.       Lumbar back: She exhibits tenderness. She exhibits normal range of motion, no swelling and no deformity.       Back:  Neurological: She is alert and oriented to person, place, and time.  Skin: Skin is warm and dry. No rash noted.  Psychiatric: She has a normal mood and affect. Her behavior is normal.    ED Course  Procedures      1. Low back pain       MDM  1:10 AM patient seen and evaluated. Patient appears well in no acute distress. She is not appear in significant discomfort. No concerning or red flag symptoms for back pain. No indications for emergent imaging today. We'll treat symptomatically and have patient followup with PCP        Angus Seller, PA-C 08/05/12 0126

## 2012-08-05 NOTE — ED Notes (Signed)
PT. REPORTS ITCHY ARMS AND BACK AFTER RECEIVING PAIN MEDICATIONS AT THIS ER THIS MORNING , AIRWAY INTACT , RESPIRATIONS UNLABORED.

## 2012-08-05 NOTE — ED Provider Notes (Signed)
History     CSN: [id number]  Arrival date & time 08/05/12  0306   First MD Initiated Contact with Patient 08/05/12 [phone]      Chief Complaint  Patient presents with  . Allergic Reaction   HPI  History provided by the patient. Patient is a 47 year old female returns to the emergency room shortly after leaving for evaluation and treatment of back pain.patient was given 2 Norco pain medications in the emergency room approximately one hour after leaving developed itching and redness to her skin of the forearms. Denies any other rash or itching symptoms. There was no shortness of breath, lip, tongue or mouth swelling. Patient denies having similar reaction to hydrocodone the past. She has been on this for chronic pains and pain clinic without similar reaction. No other aggravating or alleviating factors. No other associated symptoms.    Past Medical History  Diagnosis Date  . Boil   . Hypertension   . Arthritis   . GERD (gastroesophageal reflux disease)   . Recurrent knee pain   . Obesity     Past Surgical History  Procedure Laterality Date  . Tubal ligation    . Cholecystectomy    . Cesarean section      No family history on file.  History  Substance Use Topics  . Smoking status: Never Smoker   . Smokeless tobacco: Not on file  . Alcohol Use: Yes     Comment: sometimes    OB History   Grav Para Term Preterm Abortions TAB SAB Ect Mult Living                  Review of Systems  Respiratory: Negative for shortness of breath.   Cardiovascular: Negative for chest pain.  Skin: Positive for rash.  All other systems reviewed and are negative.    Allergies  Fish allergy; Iodine; and Norco  Home Medications   Current Outpatient Rx  Name  Route  Sig  Dispense  Refill  . lisinopril-hydrochlorothiazide (PRINZIDE,ZESTORETIC) 20-25 MG per tablet   Oral   Take 1 tablet by mouth daily.   30 tablet   11   . naproxen (NAPROSYN) 500 MG tablet   Oral   Take 500 mg by  mouth 2 (two) times daily as needed (arthritis pain).           BP 111/60  Pulse 80  Temp(Src) 97.7 F (36.5 C) (Oral)  Resp 16  SpO2 100%  LMP 07/03/2012  Physical Exam  Nursing note and vitals reviewed. Constitutional: She is oriented to person, place, and time. She appears well-developed and well-nourished. No distress.  HENT:  Head: Normocephalic.  Neck: Neck supple. No tracheal deviation present.  Cardiovascular: Normal rate and regular rhythm.   Pulmonary/Chest: Effort normal and breath sounds normal. No stridor. No respiratory distress. She has no wheezes. She has no rales.  Musculoskeletal: Normal range of motion. She exhibits no edema.  Neurological: She is alert and oriented to person, place, and time.  Skin: Skin is warm and dry. No rash noted.  No significant erythema at this time or signs of rash or urticaria. A few secondary excoriations to the forearms.  Psychiatric: She has a normal mood and affect. Her behavior is normal.    ED Course  Procedures     1. Allergic reaction, initial encounter       MDM  4:30 AM patient seen and evaluated. Significant improvement after Benadryl. Likely allergic or sensitivity to Norco dosage earlier. Patient  advised not to use his medication. Back pain was improved. She will followup with PCP for continued recommendations.        Angus Seller, PA-C 08/05/12 [phone]

## 2012-08-06 NOTE — ED Provider Notes (Signed)
Medical screening examination/treatment/procedure(s) were performed by non-physician practitioner and as supervising physician I was immediately available for consultation/collaboration.  John-Adam Steadman Prosperi, M.D.     John-Adam Cyprian Gongaware, MD 08/06/12 0508 

## 2012-08-06 NOTE — ED Provider Notes (Signed)
Medical screening examination/treatment/procedure(s) were performed by non-physician practitioner and as supervising physician I was immediately available for consultation/collaboration.  John-Adam Rodrigus Kilker, M.D.     John-Adam Jameya Pontiff, MD 08/06/12 0508 

## 2012-08-18 ENCOUNTER — Encounter (HOSPITAL_COMMUNITY): Payer: Self-pay

## 2012-08-18 ENCOUNTER — Emergency Department (HOSPITAL_COMMUNITY)
Admission: EM | Admit: 2012-08-18 | Discharge: 2012-08-18 | Disposition: A | Discharge status: Home / Self Care | Payer: Medicaid Other | Attending: Emergency Medicine | Admitting: Emergency Medicine

## 2012-08-18 DIAGNOSIS — Z8739 Personal history of other diseases of the musculoskeletal system and connective tissue: Secondary | ICD-10-CM | POA: Insufficient documentation

## 2012-08-18 DIAGNOSIS — Z8719 Personal history of other diseases of the digestive system: Secondary | ICD-10-CM | POA: Insufficient documentation

## 2012-08-18 DIAGNOSIS — M545 Low back pain, unspecified: Secondary | ICD-10-CM | POA: Insufficient documentation

## 2012-08-18 DIAGNOSIS — Z872 Personal history of diseases of the skin and subcutaneous tissue: Secondary | ICD-10-CM | POA: Insufficient documentation

## 2012-08-18 DIAGNOSIS — M25561 Pain in right knee: Secondary | ICD-10-CM

## 2012-08-18 DIAGNOSIS — Z79899 Other long term (current) drug therapy: Secondary | ICD-10-CM | POA: Insufficient documentation

## 2012-08-18 DIAGNOSIS — I10 Essential (primary) hypertension: Secondary | ICD-10-CM | POA: Insufficient documentation

## 2012-08-18 DIAGNOSIS — M549 Dorsalgia, unspecified: Secondary | ICD-10-CM

## 2012-08-18 DIAGNOSIS — IMO0001 Reserved for inherently not codable concepts without codable children: Secondary | ICD-10-CM | POA: Insufficient documentation

## 2012-08-18 DIAGNOSIS — M25569 Pain in unspecified knee: Secondary | ICD-10-CM | POA: Insufficient documentation

## 2012-08-18 DIAGNOSIS — E669 Obesity, unspecified: Secondary | ICD-10-CM | POA: Insufficient documentation

## 2012-08-18 MED ORDER — OXYCODONE-ACETAMINOPHEN 5-325 MG PO TABS
2.0000 | ORAL_TABLET | Freq: Once | ORAL | Status: AC
  Administered 2012-08-18: 2 via ORAL
  Filled 2012-08-18: qty 2

## 2012-08-18 MED ORDER — METHOCARBAMOL 500 MG PO TABS: 500.0000 mg | ORAL_TABLET | Freq: Two times a day (BID) | ORAL | Status: DC | PRN

## 2012-08-18 MED ORDER — MELOXICAM 7.5 MG PO TABS: 15.0000 mg | ORAL_TABLET | Freq: Every day | ORAL | Status: DC

## 2012-08-18 MED ORDER — DIPHENHYDRAMINE HCL 25 MG PO CAPS
25.0000 mg | ORAL_CAPSULE | Freq: Once | ORAL | Status: AC
  Administered 2012-08-18: 25 mg via ORAL
  Filled 2012-08-18: qty 1

## 2012-08-18 MED ORDER — OXYCODONE-ACETAMINOPHEN 5-325 MG PO TABS
1.0000 | ORAL_TABLET | ORAL | Status: DC | PRN
Start: 2012-08-18 — End: 2012-09-09

## 2012-08-18 NOTE — ED Provider Notes (Signed)
History     CSN: [id number]  Arrival date & time 08/18/12  0604   First MD Initiated Contact with Patient 08/18/12 0631      Chief Complaint  Patient presents with  . Back Pain    (Consider location/radiation/quality/duration/timing/severity/associated sxs/prior treatment) HPI Comments: Patient is a 47 year old female with a history of low back pain, hip pain, and bilateral knee pain who presents for these complaints x3 days. Patient states her pain has been constant and aching in nature, aggravated with ambulation, improved with rest. Patient states her back pain radiates up her back slightly and that she has taken ibuprofen without relief of symptoms. She does endorse with a narcotic pain medication she was given at her last visit provides her relief. Patient denies any recent injury or trauma to her back or b/l hips or knees. She states the pain she is experiencing is the same in location, quality, and severity of the pain she has had in the past. Patient denies fever, numbness or tingling in her extremities, weakness or inability to ambulate, saddle anesthesia, in bowel or bladder incontinence.  Patient is a 47 y.o. female presenting with back pain. The history is provided by the patient. No language interpreter was used.  Back Pain Associated symptoms: no fever, no numbness and no weakness     Past Medical History  Diagnosis Date  . Boil   . Hypertension   . Arthritis   . GERD (gastroesophageal reflux disease)   . Recurrent knee pain   . Obesity     Past Surgical History  Procedure Laterality Date  . Tubal ligation    . Cholecystectomy    . Cesarean section      No family history on file.  History  Substance Use Topics  . Smoking status: Never Smoker   . Smokeless tobacco: Not on file  . Alcohol Use: Yes     Comment: sometimes    OB History   Grav Para Term Preterm Abortions TAB SAB Ect Mult Living                  Review of Systems  Constitutional:  Negative for fever.  Musculoskeletal: Positive for myalgias, back pain and arthralgias.  Skin: Negative for color change, pallor and rash.  Neurological: Negative for syncope, weakness and numbness.  All other systems reviewed and are negative.    Allergies  Fish allergy; Iodine; and Norco  Home Medications   Current Outpatient Rx  Name  Route  Sig  Dispense  Refill  . ibuprofen (ADVIL,MOTRIN) 200 MG tablet   Oral   Take 800 mg by mouth every 8 (eight) hours as needed for pain.         Marland Kitchen lisinopril-hydrochlorothiazide (PRINZIDE,ZESTORETIC) 20-25 MG per tablet   Oral   Take 1 tablet by mouth daily.   30 tablet   11   . meloxicam (MOBIC) 7.5 MG tablet   Oral   Take 2 tablets (15 mg total) by mouth daily.   30 tablet   0   . methocarbamol (ROBAXIN) 500 MG tablet   Oral   Take 1 tablet (500 mg total) by mouth 2 (two) times daily as needed.   20 tablet   0   . oxyCODONE-acetaminophen (PERCOCET/ROXICET) 5-325 MG per tablet   Oral   Take 1 tablet by mouth every 4 (four) hours as needed for pain.   10 tablet   0     BP 106/67  Pulse 80  Temp(Src)  98 F (36.7 C) (Oral)  Resp 18  SpO2 99%  LMP 07/03/2012  Physical Exam  Nursing note and vitals reviewed. Constitutional: She is oriented to person, place, and time. No distress.  Morbidly obese female who is sitting in the bed in no acute distress. Well and nontoxic appearing.  HENT:  Head: Normocephalic and atraumatic.  Eyes: Conjunctivae are normal. No scleral icterus.  Neck: Normal range of motion.  Cardiovascular: Normal rate, regular rhythm and intact distal pulses.   Distal radial, dorsalis pedis, and posterior tibial pulses 2+ bilaterally. Capillary refill less than 2 seconds in lower extremities.  Pulmonary/Chest: Effort normal.  Musculoskeletal:       Right hip: She exhibits normal range of motion, no tenderness, no bony tenderness, no crepitus and no deformity.       Left hip: She exhibits normal range  of motion, no tenderness, no bony tenderness, no crepitus and no deformity.       Right knee: She exhibits normal range of motion, no deformity, no LCL laxity and no MCL laxity. Tenderness found. Medial joint line and lateral joint line tenderness noted.       Left knee: She exhibits normal range of motion, no deformity, no LCL laxity and no MCL laxity. Tenderness found. Medial joint line and lateral joint line tenderness noted.       Right ankle: Normal.       Left ankle: Normal.       Cervical back: Normal.       Thoracic back: Normal.       Lumbar back: She exhibits tenderness. She exhibits no bony tenderness, no swelling and no deformity.       Back:  Patient exhibits tenderness to palpation of her left lumbar paraspinal muscles as well as her bilateral medial and lateral joint lines. No thoracic or lumbar midline tenderness or palpable deformities appreciated. Patient exhibits normal range of motion of her back, hips, and knees. She is ambulatory with normal gait. No sensory or motor deficits appreciated.  Neurological: She is alert and oriented to person, place, and time. She has normal reflexes.  DTRs normal and symmetric  Skin: Skin is warm and dry. No rash noted. She is not diaphoretic. No erythema.  Psychiatric: She has a normal mood and affect. Her behavior is normal.    ED Course  Procedures (including critical care time)  Labs Reviewed - No data to display No results found.   1. Back pain   2. Knee pain, bilateral     MDM  Uncomplicated low back pain, and b/l knee pain. Patient has been seen in ED in past for same complaint and she denies any change in the pain quality, location, or severity as well as any new or recent trauma/fall. Patient is ambulatory with normal gait and neurovascularly intact. No TTP of cervical, thoracic, or lumbar midline. Stable for d/c with PCP follow up, Mobic, robaxin as needed for muscle spasm, and percocet as needed for severe pain. Orthopedic  referral also provided given chronicity of pain. Indications for ED return discussed. Patient states comfort and understanding with this d/c plan with no unaddressed concerns.        Antony Madura, [place] 08/20/12 [phone]

## 2012-08-18 NOTE — ED Notes (Signed)
Pt discharged home. Had no further questions. 

## 2012-08-18 NOTE — ED Notes (Signed)
Pt. Reports lower back pain. States chronic pain x1 year but has gotten worse. Pt. Seen here last week for same but states the pain medicine is not helping, did not follow-up. Pt. Also c/o hip and knee pain. Pt. Ambulatory. Pedal pulses intact, mild edema noted in feet. Pt. Denies chest problems.

## 2012-08-18 NOTE — ED Notes (Signed)
Pt. C/o lower back pain x1 year, hip pain and knee pain. States was seen here last week for same and pain medicine is not working. Pt. Ambulatory

## 2012-08-20 NOTE — ED Provider Notes (Signed)
Medical screening examination/treatment/procedure(s) were performed by non-physician practitioner and as supervising physician I was immediately available for consultation/collaboration.  Donnetta Hutching, MD 08/20/12 1126

## 2012-09-09 ENCOUNTER — Encounter: Payer: Self-pay | Admitting: Internal Medicine

## 2012-09-09 ENCOUNTER — Ambulatory Visit (INDEPENDENT_AMBULATORY_CARE_PROVIDER_SITE_OTHER): Payer: Medicaid Other | Admitting: Internal Medicine

## 2012-09-09 VITALS — BP 159/108 | HR 84 | Temp 96.9°F | Ht 61.0 in | Wt 318.4 lb

## 2012-09-09 DIAGNOSIS — M545 Low back pain: Secondary | ICD-10-CM

## 2012-09-09 DIAGNOSIS — R32 Unspecified urinary incontinence: Secondary | ICD-10-CM | POA: Insufficient documentation

## 2012-09-09 DIAGNOSIS — N939 Abnormal uterine and vaginal bleeding, unspecified: Secondary | ICD-10-CM | POA: Insufficient documentation

## 2012-09-09 DIAGNOSIS — N926 Irregular menstruation, unspecified: Secondary | ICD-10-CM

## 2012-09-09 DIAGNOSIS — G8929 Other chronic pain: Secondary | ICD-10-CM | POA: Insufficient documentation

## 2012-09-09 DIAGNOSIS — Z0000 Encounter for general adult medical examination without abnormal findings: Secondary | ICD-10-CM

## 2012-09-09 LAB — GLUCOSE, CAPILLARY: Glucose-Capillary: 64 mg/dL — ABNORMAL LOW (ref 70–99)

## 2012-09-09 LAB — URINALYSIS, ROUTINE W REFLEX MICROSCOPIC
Nitrite: NEGATIVE
Protein, ur: NEGATIVE mg/dL
Specific Gravity, Urine: 1.017 (ref 1.005–1.030)
Urobilinogen, UA: 1 mg/dL (ref 0.0–1.0)

## 2012-09-09 LAB — POCT URINE PREGNANCY: Preg Test, Ur: NEGATIVE

## 2012-09-09 MED ORDER — MELOXICAM 15 MG PO TABS: 15.0000 mg | ORAL_TABLET | Freq: Every day | ORAL | Status: DC

## 2012-09-09 MED ORDER — METHOCARBAMOL 500 MG PO TABS: 500.0000 mg | ORAL_TABLET | Freq: Two times a day (BID) | ORAL | Status: DC | PRN

## 2012-09-09 NOTE — Assessment & Plan Note (Addendum)
Patient has had low back pain since October 2013. No initial injury, no trauma. She does not have any red flag symptoms of numbness, tingling, no radiculopathic symptoms. Isolated lumbar back pain. Neuro exam is unremarkable. I discussed with patient her urinary incontinent symptoms. She states that this has only been going on over the last 3 weeks. Therefore, I think it would be appropriate to do some imaging of the spine. Need to rule out compression syndrome.  - MRI of the spine -Discussed that the most important thing for this patient to do is lose weight as this is putting unnecessary strain on her spine. Patient laughed at this suggestion and got frustrated with the provider for not refilling her narcotic pain medication. She did show some narcotic seeking behavior today and threatened to find a new primary care provider. -We did not refill her Percocet which she obtained from the ED -Refilled Mobic and Robaxin. Discussed how Robaxin is not ideal for long-term use. She should only use this medication as needed for muscle spasms. We also discussed not using ibuprofen or other NSAIDs, and currently with Mobic. -Patient will eventually need referral to physical therapy, although she laughed at this suggestion today -I hope the patient will work with [place] and try doing things like physical therapy and losing weight to properly treat her chronic back pain

## 2012-09-09 NOTE — Assessment & Plan Note (Addendum)
Patient complains of irregular and sporadic periods, some lasting just a few days, some lasting up to 2 weeks. It is possible this is due to the onset of menopause, however, patient has not had a Pap in 6 years. She did not seem very interested or concerned about this despite the fact that I strongly recommended screening for cervical cancer. Patient is having unprotected sex without using the method of birth control.  -Patient prefers to see gynecology, will make referral -Will need Pap smear, and appropriate workup for abnormal uterine bleeding -Check UPT -Patient will also need to discuss choosing a form of birth control with gynecologist -Recommended using condoms in the meantime

## 2012-09-09 NOTE — Patient Instructions (Addendum)
Return to clinic in 1 month for blood pressure check.  Please do not take any ibuprofen or NSAIDs while you are taking Mobic  Please pick up medications from the pharmacy

## 2012-09-09 NOTE — Progress Notes (Addendum)
Patient ID: Carrie Harvey, female   DOB: May 07, 1965, 47 y.o.   MRN: [id number]  Internal Medicine Clinic Visit    HPI:  Carrie Harvey is a 47 y.o. year old female with a history of low back pain, hip pain, bilateral knee pain, recently seen in the ED for worsening pain. She comes to clinic with several problems today.  Low back pain Patient's main complaint is severe low back pain which started last year, September 2013. It has been worse recently and she was seen in the ED on 08/18/2012 and 08/05/2012, given Mobic, Robaxin, and Percocet. She states that these 3 medicines help with the pain, although she must take Benadryl along with the narcotics as she gets itching reaction. She states that the pain is located only in the lower back, it does not travel up her spine or down in her legs. She does not have any numbness or tingling, no saddle anesthesia, no fever, chills, weight loss. She has not tried physical therapy for her back pain. She has tried ibuprofen in the past and states this does not work. Patient requests a refill of her Percocet as she has just run out.  Irregular periods Patient states that she had not had a period in 2.5 months, then had one that lasted 2 weeks. She has had some irregular periods recently with sporadic bleeding. She has just recently started being sexually active with her boyfriend. Oldest child is 57, doesn't want another child. Not using condoms or any form of birth control.   Blurry vision: can't read the paper, just having problems seeing close up. Has not tried reading glasses.  Urination problem: Sometimes doesn't have to urinate until late in the afternoon. Drinks 3-4 bottles of water and 2 coffees during the day, but often does not urinate until the evening. She feels like she loses urine during the day and so she is constantly wearing pads. She is unaware if she is losing urine but she states that it had felt like urine at the end of the day and is constantly  wet. She also states that this does not bother her. She is unsure whether this problem is worse when coughing, sneezing, laughing, or activities.  Social history: Lives at shelter, for past month or so. Not working, looking for a job.  Denies fever, no SOB, chest pain, no N,V,D,C, no numbness or tingling anywhere, no weakness.     Past Medical History  Diagnosis Date  . Boil   . Hypertension   . Arthritis   . GERD (gastroesophageal reflux disease)   . Recurrent knee pain   . Obesity     Past Surgical History  Procedure Laterality Date  . Tubal ligation    . Cholecystectomy    . Cesarean section       ROS:  A complete review of systems was otherwise negative, except as noted in the HPI.  Allergies: Fish allergy; Iodine; and Norco  Medications: Current Outpatient Prescriptions  Medication Sig Dispense Refill  . ibuprofen (ADVIL,MOTRIN) 200 MG tablet Take 800 mg by mouth every 8 (eight) hours as needed for pain.      Marland Kitchen lisinopril-hydrochlorothiazide (PRINZIDE,ZESTORETIC) 20-25 MG per tablet Take 1 tablet by mouth daily.  30 tablet  11  . meloxicam (MOBIC) 7.5 MG tablet Take 2 tablets (15 mg total) by mouth daily.  30 tablet  0  . methocarbamol (ROBAXIN) 500 MG tablet Take 1 tablet (500 mg total) by mouth 2 (two) times daily  as needed.  20 tablet  0  . oxyCODONE-acetaminophen (PERCOCET/ROXICET) 5-325 MG per tablet Take 1 tablet by mouth every 4 (four) hours as needed for pain.  10 tablet  0   No current facility-administered medications for this visit.    History   Social History  . Marital Status: Married    Spouse Name: N/A    Number of Children: N/A  . Years of Education: N/A   Occupational History  . Not on file.   Social History Main Topics  . Smoking status: Never Smoker   . Smokeless tobacco: Not on file  . Alcohol Use: Yes     Comment: sometimes  . Drug Use: No  . Sexually Active: Not Currently    Birth Control/ Protection: Surgical   Other Topics  Concern  . Not on file   Social History Narrative   Financial assistance approved for 100% discount at [facility] and has [facility] card   Rudell Cobb  March 30, 2010 3:31 PM    family history is not on file.  Physical Exam Blood pressure 159/108, pulse 84, temperature 96.9 F (36.1 C), temperature source Oral, height 5\' 1"  (1.549 m), weight 318 lb 6.4 oz (144.425 kg), SpO2 99.00%. General:  Morbidly obese, No acute distress, alert and oriented x 3, AAF HEENT:  PERRL, EOMI, no lymphadenopathy, moist mucous membranes, missing teeth Cardiovascular:  Regular rate and rhythm, no murmurs, rubs or gallops Respiratory:  Clear to auscultation bilaterally, no wheezes, rales, or rhonchi Abdomen:  Soft, extremely obese, nondistended, nontender, normoactive bowel sounds Extremities:  Warm and well-perfused, no clubbing, cyanosis, or edema.  MSK: Patient is tender to palpation along the lumbar spine, no SI joint tenderness, no appreciable muscle spasms. Sensation is intact to light touch throughout lower extremities. Strength is 5 out of 5 throughout. Patient is slow getting up from sitting, but once she takes a few steps, her gait is unremarkable. She is extremely obese, so mobility is challenging. Skin: Warm, dry, no rashes Neuro: Not anxious appearing, no depressed mood, normal affect  Labs: Lab Results  Component Value Date   CREATININE 0.69 07/16/2011   BUN 11 07/16/2011   NA 138 07/16/2011   K 4.3 07/16/2011   CL 103 07/16/2011   CO2 27 07/16/2011   Lab Results  Component Value Date   WBC 11.7* 03/18/2011   HGB 12.6 03/18/2011   HCT 39.5 03/18/2011   MCV 91.6 03/18/2011   PLT 318 03/18/2011      Assessment and Plan:    FOLLOWUP: Carrie Harvey will follow back up in our clinic in approximately 1 month. Carrie Harvey knows to call out clinic in the meantime with any questions or new issues.

## 2012-09-09 NOTE — Assessment & Plan Note (Signed)
-  Will screen patient for diabetes -Once again, she needs a Pap smear and mammogram. Mammogram ordered.

## 2012-09-09 NOTE — Assessment & Plan Note (Addendum)
Patient is a poor historian and it was much worse than talking about her chronic pain in discussing urinary incontinence today. What I could glean from the interview, the patient is leaking small amounts of urine throughout the day, she is not sure whether this is worse when she coughs, laughs, or sneezes, or with activities. Patient states she is not worried about it.  -Try Scheduled urination throughout the day -Check urinalysis -Try Kegel exercises to see if this will help -Would discuss with the patient further at next visit

## 2012-09-10 NOTE — Progress Notes (Signed)
INTERNAL MEDICINE TEACHING ATTENDING ADDENDUM: I discussed this case with Dr. Kesty soon after the patient visit. I have read the documentation and I agree with the plan of care. Please see the resident note for details of management. 

## 2012-09-11 ENCOUNTER — Other Ambulatory Visit: Payer: Self-pay | Admitting: Internal Medicine

## 2012-09-11 DIAGNOSIS — M549 Dorsalgia, unspecified: Secondary | ICD-10-CM

## 2012-09-11 NOTE — Progress Notes (Signed)
Patient phone call I called patient twice and explained concern for spinal compression with her symptoms of urinary incontinence and back pain. She continues to deny any weakness, difficulty walking, radiculopathic symptoms, but she continues to have incontinence throughout the day and lower back pain which has been about the same over the last week or so. I asked the patient to come today in for labs and an MRI of the spine and stated that it was urgent to rule out a dangerous condition that can cause permanent nerve damage and dysfunction. She repeatedly declined coming in and stated that she could come in on Monday. She was traveling to [facility] today and would not come into clinic.  I encouraged her to go to the ED if she has any symptoms of weakness or numbness or is unable to void. She stated that she would. She is planning to return to clinic Monday morning for labs and MRI of the spine.  Denton Ar, MD

## 2012-09-15 ENCOUNTER — Other Ambulatory Visit (INDEPENDENT_AMBULATORY_CARE_PROVIDER_SITE_OTHER): Payer: Medicaid Other

## 2012-09-15 DIAGNOSIS — M549 Dorsalgia, unspecified: Secondary | ICD-10-CM

## 2012-09-15 LAB — BASIC METABOLIC PANEL WITH GFR
CO2: 25 mEq/L (ref 19–32)
Calcium: 9.5 mg/dL (ref 8.4–10.5)
Creat: 0.68 mg/dL (ref 0.50–1.10)
GFR, Est African American: >89 mL/min
Sodium: 136 mEq/L (ref 135–145)

## 2012-09-24 ENCOUNTER — Ambulatory Visit (HOSPITAL_COMMUNITY): Payer: Medicaid Other

## 2012-09-29 ENCOUNTER — Encounter (HOSPITAL_COMMUNITY): Payer: Self-pay | Admitting: Nurse Practitioner

## 2012-09-29 ENCOUNTER — Emergency Department (HOSPITAL_COMMUNITY)
Admission: EM | Admit: 2012-09-29 | Discharge: 2012-09-29 | Disposition: A | Discharge status: Home / Self Care | Payer: Medicaid Other | Attending: Emergency Medicine | Admitting: Emergency Medicine

## 2012-09-29 DIAGNOSIS — Z8639 Personal history of other endocrine, nutritional and metabolic disease: Secondary | ICD-10-CM | POA: Insufficient documentation

## 2012-09-29 DIAGNOSIS — Z8739 Personal history of other diseases of the musculoskeletal system and connective tissue: Secondary | ICD-10-CM | POA: Insufficient documentation

## 2012-09-29 DIAGNOSIS — K219 Gastro-esophageal reflux disease without esophagitis: Secondary | ICD-10-CM | POA: Insufficient documentation

## 2012-09-29 DIAGNOSIS — M129 Arthropathy, unspecified: Secondary | ICD-10-CM | POA: Insufficient documentation

## 2012-09-29 DIAGNOSIS — I10 Essential (primary) hypertension: Secondary | ICD-10-CM | POA: Insufficient documentation

## 2012-09-29 DIAGNOSIS — Z87448 Personal history of other diseases of urinary system: Secondary | ICD-10-CM | POA: Insufficient documentation

## 2012-09-29 DIAGNOSIS — R1110 Vomiting, unspecified: Secondary | ICD-10-CM

## 2012-09-29 DIAGNOSIS — R1013 Epigastric pain: Secondary | ICD-10-CM | POA: Insufficient documentation

## 2012-09-29 DIAGNOSIS — E669 Obesity, unspecified: Secondary | ICD-10-CM | POA: Insufficient documentation

## 2012-09-29 DIAGNOSIS — Z872 Personal history of diseases of the skin and subcutaneous tissue: Secondary | ICD-10-CM | POA: Insufficient documentation

## 2012-09-29 DIAGNOSIS — Z862 Personal history of diseases of the blood and blood-forming organs and certain disorders involving the immune mechanism: Secondary | ICD-10-CM | POA: Insufficient documentation

## 2012-09-29 DIAGNOSIS — Z3202 Encounter for pregnancy test, result negative: Secondary | ICD-10-CM | POA: Insufficient documentation

## 2012-09-29 HISTORY — DX: Disorder of kidney and ureter, unspecified: N28.9

## 2012-09-29 HISTORY — DX: Gout, unspecified: M10.9

## 2012-09-29 LAB — COMPREHENSIVE METABOLIC PANEL
ALT: 9 U/L (ref 0–35)
Albumin: 3.4 g/dL — ABNORMAL LOW (ref 3.5–5.2)
Alkaline Phosphatase: 86 U/L (ref 39–117)
BUN: 12 mg/dL (ref 6–23)
Calcium: 8.9 mg/dL (ref 8.4–10.5)
GFR calc Af Amer: >90 mL/min (ref >90)
Glucose, Bld: 106 mg/dL — ABNORMAL HIGH (ref 70–99)
Potassium: 3.5 mEq/L (ref 3.5–5.1)
Sodium: 140 mEq/L (ref 135–145)
Total Protein: 7.6 g/dL (ref 6.0–8.3)

## 2012-09-29 LAB — POCT PREGNANCY, URINE: Preg Test, Ur: NEGATIVE

## 2012-09-29 LAB — CBC WITH DIFFERENTIAL/PLATELET
Eosinophils Relative: 3 % (ref 0–5)
HCT: 36.8 % (ref 36.0–46.0)
Hemoglobin: 12 g/dL (ref 12.0–15.0)
Lymphocytes Relative: 28 % (ref 12–46)
MCV: 88 fL (ref 78.0–100.0)
Monocytes Absolute: 0.7 10*3/uL (ref 0.1–1.0)
Monocytes Relative: 7 % (ref 3–12)
Neutro Abs: 5.9 10*3/uL (ref 1.7–7.7)
WBC: 9.6 10*3/uL (ref 4.0–10.5)

## 2012-09-29 LAB — URINALYSIS, ROUTINE W REFLEX MICROSCOPIC
Bilirubin Urine: NEGATIVE
Glucose, UA: NEGATIVE mg/dL
Hgb urine dipstick: NEGATIVE
Specific Gravity, Urine: 1.025 (ref 1.005–1.030)
Urobilinogen, UA: 0.2 mg/dL (ref 0.0–1.0)
pH: 5.5 (ref 5.0–8.0)

## 2012-09-29 MED ORDER — OMEPRAZOLE 20 MG PO CPDR: 20.0000 mg | DELAYED_RELEASE_CAPSULE | Freq: Every day | ORAL | Status: DC

## 2012-09-29 MED ORDER — FAMOTIDINE 40 MG PO TABS: 40.0000 mg | ORAL_TABLET | Freq: Every day | ORAL | Status: DC

## 2012-09-29 MED ORDER — GI COCKTAIL ~~LOC~~
30.0000 mL | Freq: Once | ORAL | Status: AC
  Administered 2012-09-29: 30 mL via ORAL
  Filled 2012-09-29: qty 30

## 2012-09-29 NOTE — ED Notes (Signed)
PA at bedside.

## 2012-09-29 NOTE — ED Notes (Signed)
Per ems: pt called for abd pain, n/v after eating chinese buffet today. Found sitting outside in the heat at a bus stop, states she was drinking heavily last night, drank several [place] ice teas. En route, VSS,a&Ox4.

## 2012-09-29 NOTE — ED Provider Notes (Signed)
History     CSN: [id number]  Arrival date & time 09/29/12  1323   First MD Initiated Contact with Patient 09/29/12 1714      Chief Complaint  Patient presents with  . Emesis    (Consider location/radiation/quality/duration/timing/severity/associated sxs/prior treatment) HPI Carrie Harvey is a 47 y.o. female with a history of acid reflux, obesity and hypertension presents emergency department complaining of epigastric burning and emesis.  Patient states that she went out last night and drank heavily having multiple [place] iced teas and ate a [place] buffet today.  Ever since the evening patient has been having worsened GERD type symptoms and emesis.  Mild epigastric abdominal pain as well.  Patient denies any melena, hematemesis, hematochezia, significant abdominal pain, diarrhea, constipation.  Past Medical History  Diagnosis Date  . Boil   . Hypertension   . Arthritis   . GERD (gastroesophageal reflux disease)   . Recurrent knee pain   . Obesity   . Gout   . Renal disorder     Past Surgical History  Procedure Laterality Date  . Tubal ligation    . Cholecystectomy    . Cesarean section      History reviewed. No pertinent family history.  History  Substance Use Topics  . Smoking status: Never Smoker   . Smokeless tobacco: Not on file  . Alcohol Use: Yes     Comment: heavy    OB History   Grav Para Term Preterm Abortions TAB SAB Ect Mult Living                  Review of Systems  All other systems reviewed and are negative.    Allergies  Fish allergy; Iodine; and Norco  Home Medications   Current Outpatient Rx  Name  Route  Sig  Dispense  Refill  . lisinopril-hydrochlorothiazide (PRINZIDE,ZESTORETIC) 20-25 MG per tablet   Oral   Take 1 tablet by mouth daily.   30 tablet   11   . meloxicam (MOBIC) 15 MG tablet   Oral   Take 1 tablet (15 mg total) by mouth daily.   40 tablet   0   . methocarbamol (ROBAXIN) 500 MG tablet   Oral   Take 1  tablet (500 mg total) by mouth 2 (two) times daily as needed.   40 tablet   0   . omeprazole (PRILOSEC) 20 MG capsule   Oral   Take 20 mg by mouth daily.         . famotidine (PEPCID) 40 MG tablet   Oral   Take 1 tablet (40 mg total) by mouth daily.   30 tablet   0   . omeprazole (PRILOSEC) 20 MG capsule   Oral   Take 1 capsule (20 mg total) by mouth daily.   30 capsule   0     BP 142/95  Pulse 84  Temp(Src) 98.1 F (36.7 C) (Oral)  Resp 18  SpO2 100%  Physical Exam  Nursing note and vitals reviewed. Constitutional: She is oriented to person, place, and time. She appears well-developed and well-nourished. No distress.  HENT:  Head: Normocephalic and atraumatic.  Eyes: Conjunctivae and EOM are normal.  Neck: Normal range of motion.  Cardiovascular: Intact distal pulses.   Regular rate rhythm.  No aberrancy and auscultation.  Pulmonary/Chest: Effort normal.  Abdominal:  Obese abdomen with mild epigastric tenderness to palpation.  No peritoneal signs.  Musculoskeletal: Normal range of motion.  Neurological: She is  alert and oriented to person, place, and time.  Skin: Skin is warm and dry. No rash noted. She is not diaphoretic.  Psychiatric: She has a normal mood and affect. Her behavior is normal.    ED Course  Procedures (including critical care time)  Labs Reviewed  COMPREHENSIVE METABOLIC PANEL - Abnormal; Notable for the following:    Glucose, Bld 106 (*)    Albumin 3.4 (*)    Total Bilirubin 0.1 (*)    All other components within normal limits  CBC WITH DIFFERENTIAL  LIPASE, BLOOD  URINALYSIS, ROUTINE W REFLEX MICROSCOPIC  POCT PREGNANCY, URINE   No results found.   1. GERD (gastroesophageal reflux disease)   2. Emesis       MDM  GERD  Patient emergency department complaining of worsening GERD type symptoms after eating Chinese buffet and drinking heavily last evening.  Symptoms significantly improved with GI cocktail.  Patient chest  pain-free throughout hospital stay.  Advise taking Prilosec and Pepcid.  Strict return precautions for development of chest pain that is exertional, associated with shortness of breath, nausea, or diaphoresis.  Patient verbalizes understanding and is agreeable with disposition.        Jaci Carrel, [place] 09/29/12 1933

## 2012-09-30 NOTE — ED Provider Notes (Signed)
Medical screening examination/treatment/procedure(s) were performed by non-physician practitioner and as supervising physician I was immediately available for consultation/collaboration.  Flint Melter, MD 09/30/12 [phone]

## 2012-10-07 ENCOUNTER — Encounter: Payer: Medicaid Other | Admitting: Obstetrics & Gynecology

## 2012-10-07 ENCOUNTER — Ambulatory Visit (HOSPITAL_COMMUNITY): Payer: Medicaid Other

## 2012-10-12 ENCOUNTER — Ambulatory Visit: Payer: Medicaid Other | Admitting: Internal Medicine

## 2012-10-19 ENCOUNTER — Ambulatory Visit: Payer: Medicaid Other | Admitting: Internal Medicine

## 2012-10-19 ENCOUNTER — Ambulatory Visit (HOSPITAL_COMMUNITY): Payer: Medicaid Other

## 2012-10-26 ENCOUNTER — Ambulatory Visit: Payer: Medicaid Other | Admitting: Internal Medicine

## 2012-11-04 ENCOUNTER — Ambulatory Visit: Payer: Medicaid Other | Admitting: Internal Medicine

## 2012-11-05 ENCOUNTER — Ambulatory Visit (HOSPITAL_COMMUNITY): Payer: Medicaid Other | Attending: Internal Medicine

## 2012-11-07 ENCOUNTER — Encounter (HOSPITAL_COMMUNITY): Payer: Self-pay | Admitting: *Deleted

## 2012-11-07 ENCOUNTER — Emergency Department (HOSPITAL_COMMUNITY)
Admission: EM | Admit: 2012-11-07 | Discharge: 2012-11-07 | Disposition: A | Discharge status: Home / Self Care | Payer: Medicaid Other | Attending: Emergency Medicine | Admitting: Emergency Medicine

## 2012-11-07 DIAGNOSIS — M129 Arthropathy, unspecified: Secondary | ICD-10-CM | POA: Insufficient documentation

## 2012-11-07 DIAGNOSIS — R51 Headache: Secondary | ICD-10-CM | POA: Insufficient documentation

## 2012-11-07 DIAGNOSIS — E669 Obesity, unspecified: Secondary | ICD-10-CM | POA: Insufficient documentation

## 2012-11-07 DIAGNOSIS — Z888 Allergy status to other drugs, medicaments and biological substances status: Secondary | ICD-10-CM | POA: Insufficient documentation

## 2012-11-07 DIAGNOSIS — H6500 Acute serous otitis media, unspecified ear: Secondary | ICD-10-CM | POA: Insufficient documentation

## 2012-11-07 DIAGNOSIS — M549 Dorsalgia, unspecified: Secondary | ICD-10-CM | POA: Insufficient documentation

## 2012-11-07 DIAGNOSIS — K219 Gastro-esophageal reflux disease without esophagitis: Secondary | ICD-10-CM | POA: Insufficient documentation

## 2012-11-07 DIAGNOSIS — I129 Hypertensive chronic kidney disease with stage 1 through stage 4 chronic kidney disease, or unspecified chronic kidney disease: Secondary | ICD-10-CM | POA: Insufficient documentation

## 2012-11-07 DIAGNOSIS — H7290 Unspecified perforation of tympanic membrane, unspecified ear: Secondary | ICD-10-CM | POA: Insufficient documentation

## 2012-11-07 DIAGNOSIS — Z862 Personal history of diseases of the blood and blood-forming organs and certain disorders involving the immune mechanism: Secondary | ICD-10-CM | POA: Insufficient documentation

## 2012-11-07 DIAGNOSIS — Z8639 Personal history of other endocrine, nutritional and metabolic disease: Secondary | ICD-10-CM | POA: Insufficient documentation

## 2012-11-07 DIAGNOSIS — N289 Disorder of kidney and ureter, unspecified: Secondary | ICD-10-CM | POA: Insufficient documentation

## 2012-11-07 DIAGNOSIS — H6592 Unspecified nonsuppurative otitis media, left ear: Secondary | ICD-10-CM

## 2012-11-07 DIAGNOSIS — Z79899 Other long term (current) drug therapy: Secondary | ICD-10-CM | POA: Insufficient documentation

## 2012-11-07 MED ORDER — TRAMADOL HCL 50 MG PO TABS
50.0000 mg | ORAL_TABLET | Freq: Once | ORAL | Status: AC
  Administered 2012-11-07: 50 mg via ORAL
  Filled 2012-11-07: qty 1

## 2012-11-07 MED ORDER — OFLOXACIN 0.3 % OT SOLN: 5.0000 [drp] | Freq: Two times a day (BID) | OTIC | Status: DC

## 2012-11-07 MED ORDER — TRAMADOL HCL 50 MG PO TABS: 50.0000 mg | ORAL_TABLET | Freq: Four times a day (QID) | ORAL | Status: DC | PRN

## 2012-11-07 NOTE — ED Notes (Signed)
PT states she heard left ear drum pop yesterday and having drainage.  Pt reports lower back pain that is chronic

## 2012-11-07 NOTE — ED Provider Notes (Signed)
History  This chart was scribed for non-physician practitioner. Francee Piccolo, PA-C, working with Richardean Canal, MD, by Yevette Edwards, ED Scribe. This patient was seen in room TR06C/TR06C and the patient's care was started at 5:03 PM  CSN: [id number] Arrival date & time 11/07/12  1642  First MD Initiated Contact with Patient 11/07/12 1652     No chief complaint on file.   The history is provided by the patient. No language interpreter was used.    HPI Comments: Carrie Harvey is a 47 y.o. female who presents to the Emergency Department complaining of pain to her left ear which began yesterday after she heard her ear "pop" while she was walking. She states that the pain has been a constant sharp pain that she rates 8/10 since the incident, and that she has experienced a mild generalized headache due to the ear pain. The pt reports that she has experienced a steady light-yellow drainage from the ear. She denies any fever, chills, sore throat, rhinorrhea, or any cold symptoms. She also denies any hearing loss.The pt states she has taken Advil to help the pain, but the Advil did not alleviate the pain.  The pt reports having experienced swimmer's ear previously, but she cannot recall the affected ear.  Past Medical History  Diagnosis Date  . Boil   . Hypertension   . Arthritis   . GERD (gastroesophageal reflux disease)   . Recurrent knee pain   . Obesity   . Gout   . Renal disorder    Past Surgical History  Procedure Laterality Date  . Tubal ligation    . Cholecystectomy    . Cesarean section     No family history on file. History  Substance Use Topics  . Smoking status: Never Smoker   . Smokeless tobacco: Not on file  . Alcohol Use: Yes     Comment: heavy   No OB history provided.  Review of Systems  Constitutional: Negative for fever and chills.  HENT: Positive for ear pain and ear discharge. Negative for congestion, sore throat, rhinorrhea and sneezing.   Eyes:  Negative for visual disturbance.  Respiratory: Negative for cough.   Musculoskeletal: Positive for back pain.  Neurological: Positive for headaches.    Allergies  Fish allergy; Iodine; and Norco  Home Medications   Current Outpatient Rx  Name  Route  Sig  Dispense  Refill  . ibuprofen (ADVIL,MOTRIN) 200 MG tablet   Carrie   Take 400 mg by mouth every 6 (six) hours as needed for pain.         Marland Kitchen lisinopril-hydrochlorothiazide (PRINZIDE,ZESTORETIC) 20-25 MG per tablet   Carrie   Take 1 tablet by mouth daily.         . meloxicam (MOBIC) 15 MG tablet   Carrie   Take 15 mg by mouth daily.         . methocarbamol (ROBAXIN) 500 MG tablet   Carrie   Take 500 mg by mouth 2 (two) times daily as needed (for muscle relaxer).         Marland Kitchen omeprazole (PRILOSEC) 20 MG capsule   Carrie   Take 20 mg by mouth daily.         Marland Kitchen ofloxacin (FLOXIN) 0.3 % otic solution   Otic   Place 5 drops in ear(s) 2 (two) times daily.   10 mL   0   . traMADol (ULTRAM) 50 MG tablet   Carrie   Take 1 tablet (  50 mg total) by mouth every 6 (six) hours as needed for pain.   15 tablet   0    Triage Vitals: BP 145/91  Pulse 87  Temp(Src) 99 F (37.2 C) (Carrie)  Resp 18  SpO2 96%  Physical Exam  Constitutional: She is oriented to person, place, and time. She appears well-developed and well-nourished. No distress.  HENT:  Head: Normocephalic and atraumatic.  Right Ear: Hearing, tympanic membrane, external ear and ear canal normal. No mastoid tenderness.  Left Ear: Hearing, external ear and ear canal normal. No mastoid tenderness. Tympanic membrane is perforated.  Nose: Nose normal.  Mouth/Throat: Uvula is midline, oropharynx is clear and moist and mucous membranes are normal.  No drainage apparent.   Eyes: Conjunctivae are normal.  Neck: Normal range of motion. Neck supple. No spinous process tenderness present. No edema and no erythema present.  Lymphadenopathy:    She has no cervical adenopathy.   Neurological: She is alert and oriented to person, place, and time.  Skin: Skin is warm and dry. She is not diaphoretic.  Psychiatric: She has a normal mood and affect.    ED Course  Procedures (including critical care time)  DIAGNOSTIC STUDIES: Oxygen Saturation is 96% on room air, normal  by my interpretation.    COORDINATION OF CARE:  5:07 PM- Discussed treatment plan with pt which includes pain medication as well as a possible follow-up with a ENY. The pt agreed.     Labs Reviewed - No data to display No results found. 1. Otitis media, serous, TM rupture, left     MDM  HEENT exam remarkable for ruptured left TM without drainage. No hearing loss noted on examination. Patient will be prescribed antibiotic drops. Water precautions discussed. ENT followup advised. Return precautions discussed. Pain medication indicated. Patient agreeable to plan. Patient still at time of discharge.  I personally performed the services described in this documentation, which was scribed in my presence. The recorded information has been reviewed and is accurate.    Lise Auer Meriam Chojnowski, PA-C 11/07/12 1800

## 2012-11-08 NOTE — ED Provider Notes (Signed)
Medical screening examination/treatment/procedure(s) were performed by non-physician practitioner and as supervising physician I was immediately available for consultation/collaboration.   David H Yao, MD 11/08/12 0004 

## 2012-11-09 ENCOUNTER — Encounter: Payer: Self-pay | Admitting: Obstetrics and Gynecology

## 2012-11-09 ENCOUNTER — Ambulatory Visit (INDEPENDENT_AMBULATORY_CARE_PROVIDER_SITE_OTHER): Payer: Medicaid Other | Admitting: Obstetrics and Gynecology

## 2012-11-09 VITALS — BP 144/94 | HR 75 | Temp 97.7°F | Ht 61.0 in | Wt 311.4 lb

## 2012-11-09 DIAGNOSIS — N949 Unspecified condition associated with female genital organs and menstrual cycle: Secondary | ICD-10-CM

## 2012-11-09 DIAGNOSIS — Z1231 Encounter for screening mammogram for malignant neoplasm of breast: Secondary | ICD-10-CM

## 2012-11-09 DIAGNOSIS — N938 Other specified abnormal uterine and vaginal bleeding: Secondary | ICD-10-CM

## 2012-11-09 LAB — POCT PREGNANCY, URINE: Preg Test, Ur: NEGATIVE

## 2012-11-09 MED ORDER — MEDROXYPROGESTERONE ACETATE 150 MG/ML IM SUSP
150.0000 mg | Freq: Once | INTRAMUSCULAR | Status: AC
  Administered 2012-11-09: 150 mg via INTRAMUSCULAR

## 2012-11-09 MED ORDER — MEDROXYPROGESTERONE ACETATE 150 MG/ML IM SUSP: 150.0000 mg | Freq: Once | INTRAMUSCULAR | Status: DC

## 2012-11-09 NOTE — Progress Notes (Signed)
CC: Vaginal Bleeding      HPI Carrie Harvey is a 47 y.o.  [id number] Here for evaluation of abnormal vaginal bleeding. She states her menstrual periods were normal until about a year ago. At that time she was amenorrheic for about 4 months then had a heavy period. Next months period was heavy and she then skipped 2 months. She last began bleeding heavily 11/27/2012- 12/01/2012, again heavily 8/112014 continuing now.  She has left her husband who is 31 and recently started having a new partner 2 months ago. Had negative STD tests, HIV since then.  Last hemoglobin in June was 12.0. UPT was also negative. She is overdue for Pap and has not had a mammogram. She does report some facial hirsutism. Has PMD and regular care [facility].  Past Medical History  Diagnosis Date  . Boil   . Hypertension   . Arthritis   . GERD (gastroesophageal reflux disease)   . Recurrent knee pain   . Obesity   . Gout   . Renal disorder     OB History   Grav Para Term Preterm Abortions TAB SAB Ect Mult Living                  Past Surgical History  Procedure Laterality Date  . Tubal ligation    . Cholecystectomy    . Cesarean section    C/S x2  History   Social History  . Marital Status: Married    Spouse Name: N/A    Number of Children: N/A  . Years of Education: N/A   Occupational History  . Not on file.   Social History Main Topics  . Smoking status: Never Smoker   . Smokeless tobacco: Not on file  . Alcohol Use: Yes     Comment: heavy  . Drug Use: No  . Sexually Active: Not Currently    Birth Control/ Protection: Surgical   Other Topics Concern  . Not on file   Social History Narrative   Financial assistance approved for 100% discount at [facility] and has [facility] card   Rudell Cobb  March 30, 2010 3:31 PM    Current Outpatient Prescriptions on File Prior to Visit  Medication Sig Dispense Refill  . ibuprofen (ADVIL,MOTRIN) 200 MG tablet Take 400 mg by mouth every 6 (six) hours as needed for  pain.      Marland Kitchen lisinopril-hydrochlorothiazide (PRINZIDE,ZESTORETIC) 20-25 MG per tablet Take 1 tablet by mouth daily.      . methocarbamol (ROBAXIN) 500 MG tablet Take 500 mg by mouth 2 (two) times daily as needed (for muscle relaxer).      Marland Kitchen omeprazole (PRILOSEC) 20 MG capsule Take 20 mg by mouth daily.      . meloxicam (MOBIC) 15 MG tablet Take 15 mg by mouth daily.      Marland Kitchen ofloxacin (FLOXIN) 0.3 % otic solution Place 5 drops in ear(s) 2 (two) times daily.  10 mL  0  . traMADol (ULTRAM) 50 MG tablet Take 1 tablet (50 mg total) by mouth every 6 (six) hours as needed for pain.  15 tablet  0   No current facility-administered medications on file prior to visit.    Allergies  Allergen Reactions  . Fish Allergy Swelling    Shrimp   . Iodine Swelling    When ate "a lot of shrimp"  . Norco (Hydrocodone-Acetaminophen) Itching    ROS Pertinent items in HPI  PHYSICAL EXAM Filed Vitals:   11/09/12 1438  BP: 144/94  Pulse: 75  Temp: 97.7 F (36.5 C)   General: Mobidly obese female in no acute distress Cardiovascular: Normal rate Respiratory: Normal effort Abdomen: Soft, nontender. obese Back: No CVAT Extremities: No edema Neurologic: Alert and oriented Speculum exam: deferred   ASSESSMENT/PLAN  1. DUB (dysfunctional uterine bleeding)   C/W Dr. Macon Large: offered pt Megace or Depo and she elects Depo today.  TSH, CBC done. Pelvic US scheduled. Mammogram scheduled.  Return 2-3 wks for Pap, endometrial biopsy.       Danae Orleans, CNM 11/09/2012 3:22 PM

## 2012-11-09 NOTE — Patient Instructions (Signed)

## 2012-11-10 LAB — CBC
Hemoglobin: 12.3 g/dL (ref 12.0–15.0)
MCH: 28 pg (ref 26.0–34.0)
Platelets: 327 10*3/uL (ref 150–400)
RBC: 4.4 MIL/uL (ref 3.87–5.11)

## 2012-11-10 LAB — TSH: TSH: 1.36 u[IU]/mL (ref 0.350–4.500)

## 2012-11-16 ENCOUNTER — Ambulatory Visit (HOSPITAL_COMMUNITY): Payer: Medicaid Other

## 2012-11-18 ENCOUNTER — Ambulatory Visit (HOSPITAL_COMMUNITY): Payer: Medicaid Other

## 2012-11-20 ENCOUNTER — Ambulatory Visit (HOSPITAL_COMMUNITY): Payer: Medicaid Other

## 2012-11-27 ENCOUNTER — Ambulatory Visit (HOSPITAL_COMMUNITY): Payer: Medicaid Other

## 2012-11-27 ENCOUNTER — Ambulatory Visit (HOSPITAL_COMMUNITY): Admission: RE | Admit: 2012-11-27 | Payer: Medicaid Other | Source: Ambulatory Visit

## 2012-11-30 ENCOUNTER — Ambulatory Visit (HOSPITAL_COMMUNITY): Payer: Medicaid Other

## 2012-11-30 ENCOUNTER — Ambulatory Visit (HOSPITAL_COMMUNITY): Admission: RE | Admit: 2012-11-30 | Payer: Medicaid Other | Source: Ambulatory Visit

## 2012-12-01 ENCOUNTER — Ambulatory Visit: Payer: Medicaid Other

## 2012-12-01 ENCOUNTER — Telehealth: Payer: Self-pay | Admitting: *Deleted

## 2012-12-01 NOTE — Telephone Encounter (Signed)
Pt called with c/o dull pain to right side for 3 days.  Denies dysuria, frequency.   She is having a hard time voiding and unable to hold urine.  Pain is constant.  Will see today

## 2012-12-02 ENCOUNTER — Ambulatory Visit: Payer: Medicaid Other

## 2012-12-02 ENCOUNTER — Encounter: Payer: Self-pay | Admitting: Internal Medicine

## 2013-01-01 ENCOUNTER — Telehealth: Payer: Self-pay | Admitting: Internal Medicine

## 2013-01-01 NOTE — Telephone Encounter (Signed)
  INTERNAL MEDICINE RESIDENCY PROGRAM After-Hours Telephone Call    Reason for call:  I received a call from [name] at 7:22 PM, 01/01/2013 indicating that last Friday started cramping really badly until last night, and then started cycle last night, has needed to use 3 pads, usually uses 1-2 pads, though did have heavy cycles in August causing her to use up to 5 pads. Received Depo shot on 12/25/12 (though I didn't see this documented - last GYN note from July 2014, but HPI documents symptoms from August).  Last sexually active on 12/29/12, does not use condoms. Denies dizziness, but having a lot of cramps. Denies chest pain or shortness of breath.  Of note, Carrie Harvey has history of DUB and is followed at the GYN clinic.    Assessment / Plan / Recommendations:   Advised patient to call GYN on call, and if symptoms persist or worsen, would definitely need to be evaluated for possible miscarriage.  She is awaiting call back from MD (already spoke with GYN RN).   As always, pt is advised that if symptoms worsen or new symptoms arise, they should go to an urgent care facility or to to ER for further evaluation.    Belia Heman, MD   01/01/2013, 7:22 PM

## 2013-01-22 ENCOUNTER — Telehealth: Payer: Self-pay | Admitting: *Deleted

## 2013-01-22 NOTE — Telephone Encounter (Signed)
Patient left message stating that the shot we gave her is causing her to still bleed. She wants to talk to someone about it.

## 2013-01-25 ENCOUNTER — Ambulatory Visit: Payer: Medicaid Other

## 2013-01-28 ENCOUNTER — Ambulatory Visit (INDEPENDENT_AMBULATORY_CARE_PROVIDER_SITE_OTHER): Payer: Medicaid Other | Admitting: Obstetrics and Gynecology

## 2013-01-28 VITALS — BP 142/93 | HR 94

## 2013-01-28 DIAGNOSIS — N949 Unspecified condition associated with female genital organs and menstrual cycle: Secondary | ICD-10-CM

## 2013-01-28 DIAGNOSIS — N938 Other specified abnormal uterine and vaginal bleeding: Secondary | ICD-10-CM

## 2013-01-28 MED ORDER — MEDROXYPROGESTERONE ACETATE 150 MG/ML IM SUSP: 150.0000 mg | Freq: Once | INTRAMUSCULAR | Status: AC

## 2013-01-28 MED ORDER — MEDROXYPROGESTERONE ACETATE 104 MG/0.65ML ~~LOC~~ SUSP
104.0000 mg | Freq: Once | SUBCUTANEOUS | Status: AC
  Administered 2013-01-28: 104 mg via SUBCUTANEOUS

## 2013-01-28 NOTE — Telephone Encounter (Signed)
Pt in clinic today.

## 2013-01-29 ENCOUNTER — Ambulatory Visit: Payer: Medicaid Other | Admitting: Internal Medicine

## 2013-02-02 ENCOUNTER — Ambulatory Visit (HOSPITAL_COMMUNITY)
Admission: RE | Admit: 2013-02-02 | Discharge: 2013-02-02 | Disposition: A | Discharge status: Home / Self Care | Payer: Medicaid Other | Source: Ambulatory Visit | Attending: Obstetrics & Gynecology | Admitting: Obstetrics & Gynecology

## 2013-02-02 DIAGNOSIS — N938 Other specified abnormal uterine and vaginal bleeding: Secondary | ICD-10-CM

## 2013-02-02 DIAGNOSIS — N9489 Other specified conditions associated with female genital organs and menstrual cycle: Secondary | ICD-10-CM | POA: Insufficient documentation

## 2013-02-02 DIAGNOSIS — E669 Obesity, unspecified: Secondary | ICD-10-CM | POA: Insufficient documentation

## 2013-02-02 DIAGNOSIS — Z1231 Encounter for screening mammogram for malignant neoplasm of breast: Secondary | ICD-10-CM

## 2013-02-02 DIAGNOSIS — D259 Leiomyoma of uterus, unspecified: Secondary | ICD-10-CM | POA: Insufficient documentation

## 2013-02-03 ENCOUNTER — Telehealth: Payer: Self-pay | Admitting: General Practice

## 2013-02-03 NOTE — Telephone Encounter (Signed)
Patient called and left message stating she wants to know if the doctor can call her in anything for pain because she is cramping badly and having some bleeding. Called patient stating I was returning her phone call. Patient stated that she had an ultrasound yesterday and they went in her vagina and she woke up this morning and her bed was messed up from bleeding and she's hurting and needs something for pain. Told patient that if she was having excessive bleeding or saturating a pad in less than an hour then she needed to go to the ER for further evaluation because that would be too much bleeding. Patient cut me off mid sentence and stated she was seen here recently and got the depo shot and she's still bleeding. Told patient that the depo shot can cause some irregular bleeding but it shouldn't be heavy. Patient continued to cut me off stating she was cramping badly and needed something for pain (Patient continued to talk to someone in the background repeating conversation) and that we should have her ultrasound results by now. Tried to review ultrasound results with patient but she continued to interrupt and cut me off mid sentence and kept mentioning cramping badly and needing pain medication and that OTC stuff like ibuprofen wasn't helping and she isn't supposed to take that stuff anyway because of her stomach and liver. Patient was very flighty during phone call and was difficult to speak with because she ignored or cut me off each time I spoke. Patient stated I just need to call my doctor. Told patient she was calling her doctors office. Patient stated I'm just going to call my doctor and hung up. Note patient's appt with [place] for endo bx is for 11/10. Spoke with Mayra Neer and got sooner appt for patient of tomorrow 10/9 at 2:45. Called patient back and informed her of sooner appt, patient stated she would be here then. Reminded patient if she misses this appt then we won't be able to see her until  November. Patient verbalized understanding and had no further questions.

## 2013-02-04 ENCOUNTER — Other Ambulatory Visit: Payer: Medicaid Other | Admitting: Obstetrics & Gynecology

## 2013-02-14 ENCOUNTER — Encounter: Payer: Self-pay | Admitting: Internal Medicine

## 2013-02-22 ENCOUNTER — Ambulatory Visit: Payer: Medicaid Other | Admitting: Internal Medicine

## 2013-02-26 ENCOUNTER — Ambulatory Visit (INDEPENDENT_AMBULATORY_CARE_PROVIDER_SITE_OTHER): Payer: Medicaid Other | Admitting: Internal Medicine

## 2013-02-26 ENCOUNTER — Encounter: Payer: Self-pay | Admitting: Internal Medicine

## 2013-02-26 ENCOUNTER — Ambulatory Visit: Payer: Medicaid Other | Admitting: Internal Medicine

## 2013-02-26 VITALS — BP 141/94 | HR 79 | Temp 97.7°F | Ht 61.5 in | Wt 314.7 lb

## 2013-02-26 DIAGNOSIS — M25561 Pain in right knee: Secondary | ICD-10-CM

## 2013-02-26 DIAGNOSIS — M545 Low back pain: Secondary | ICD-10-CM

## 2013-02-26 DIAGNOSIS — G8929 Other chronic pain: Secondary | ICD-10-CM

## 2013-02-26 DIAGNOSIS — M1990 Unspecified osteoarthritis, unspecified site: Secondary | ICD-10-CM

## 2013-02-26 DIAGNOSIS — N920 Excessive and frequent menstruation with regular cycle: Secondary | ICD-10-CM

## 2013-02-26 DIAGNOSIS — I10 Essential (primary) hypertension: Secondary | ICD-10-CM

## 2013-02-26 DIAGNOSIS — R51 Headache: Secondary | ICD-10-CM

## 2013-02-26 LAB — CBC
Hemoglobin: 13 g/dL (ref 12.0–15.0)
MCHC: 33.8 g/dL (ref 30.0–36.0)
RDW: 13.8 % (ref 11.5–15.5)
WBC: 9.8 10*3/uL (ref 4.0–10.5)

## 2013-02-26 LAB — BASIC METABOLIC PANEL WITH GFR
BUN: 8 mg/dL (ref 6–23)
Chloride: 103 mEq/L (ref 96–112)
Creat: 0.68 mg/dL (ref 0.50–1.10)
GFR, Est African American: >89 mL/min
GFR, Est Non African American: >89 mL/min
Potassium: 4.3 mEq/L (ref 3.5–5.3)

## 2013-02-26 LAB — LIPID PANEL
Cholesterol: 151 mg/dL (ref 0–200)
HDL: 45 mg/dL (ref >39)
Triglycerides: 72 mg/dL (ref <150)

## 2013-02-26 MED ORDER — MELOXICAM 15 MG PO TABS: 15.0000 mg | ORAL_TABLET | Freq: Every day | ORAL | Status: DC

## 2013-02-26 MED ORDER — METHOCARBAMOL 500 MG PO TABS: 500.0000 mg | ORAL_TABLET | Freq: Two times a day (BID) | ORAL | Status: DC | PRN

## 2013-02-26 MED ORDER — LISINOPRIL-HYDROCHLOROTHIAZIDE 20-25 MG PO TABS: 1.0000 | ORAL_TABLET | Freq: Every day | ORAL | Status: DC

## 2013-02-26 MED ORDER — OMEPRAZOLE 20 MG PO CPDR: 20.0000 mg | DELAYED_RELEASE_CAPSULE | Freq: Every day | ORAL | Status: DC

## 2013-02-26 NOTE — Assessment & Plan Note (Signed)
Following with gynecology at [facility]. Encouraged her to keep appts there. Will do cbc today for Hgb.

## 2013-02-26 NOTE — Patient Instructions (Addendum)
General Instructions: Please restart your medications for blood pressure  Please stop taking Ibuprofen  Please take Mobic 15 mg daily for your pain. If your pain persists, we will refer you to pain clinic   Please follow up with your gynecologist for your vaginal bleeding  I encourage you to talk to Lupita Leash about weight loss. Please follow up here in 1 month     Treatment Goals:  Goals (1 Years of Data) as of 02/26/13   None      Progress Toward Treatment Goals:  Treatment Goal 02/26/2013  Blood pressure unchanged    Self Care Goals & Plans:  Self Care Goal 02/26/2013  Manage my medications take my medicines as prescribed; bring my medications to every visit; refill my medications on time; follow the sick day instructions if I am sick  Monitor my health -  Eat healthy foods eat more vegetables; eat fruit for snacks and desserts; eat baked foods instead of fried foods; eat smaller portions  Be physically active take a walk every day; find an activity I enjoy    No flowsheet data found.   Care Management & Community Referrals:  Referral 02/26/2013  Referrals made for care management support none needed

## 2013-02-26 NOTE — Assessment & Plan Note (Signed)
BP Readings from Last 3 Encounters:  02/26/13 141/94  01/28/13 142/93  11/09/12 144/94    Lab Results  Component Value Date   NA 140 09/29/2012   K 3.5 09/29/2012   CREATININE 0.51 09/29/2012    Assessment: Blood pressure control: moderately elevated Progress toward BP goal:  unchanged Comments: off medications for 2 months   Plan: Medications:  restart Lisnopril-HCTZ 20-25mg  daily. Educational resources provided: Comptroller tools provided: home blood pressure meter Other plans: BMET today  Recheck lipids  Follow up in 1 month with PCP

## 2013-02-26 NOTE — Progress Notes (Signed)
Patient ID: RAINA SOLE, female   DOB: [birth year], 47 y.o.   MRN: [id number]   Subjective:   HPI: Ms.Merrisa M Kittelson is a 47 y.o. woman with past medical history of obesity, hypertension, chronic low back pain, and osteoarthritis of bilateral knees presents to the clinic requesting for her medication refills.  Patient's complaints today include presents to irregular vaginal bleeding, and persistent pain in both knees and low back.   Vaginal bleeding: Patient has a history of DUB and is being followed at [facility] for evaluation of vaginal bleeding. She is on Depo injections for several months but she states that her vaginal bleeding and abdominal cramps have not improved. She has an upcoming appointment with them.   Chronic pain: Patient's chronic pain is persistent and she reports, that she's been off her Tramadol, Ibuprofen, and Mobic for several weeks due to running out insurance coverage issues. She was recently approved for Medicaid and she feels she can afford her medications now.  Hypertension: she is has been off Lisinopril-HCTZ for more than 2 months due to cost. She has medicaid now.  Kindly see the A&P for the status of the pt's chronic medical problems.     Past Medical History  Diagnosis Date  . Boil   . Hypertension   . Arthritis   . GERD (gastroesophageal reflux disease)   . Recurrent knee pain   . Obesity   . Gout   . Renal disorder    Current Outpatient Prescriptions  Medication Sig Dispense Refill  . medroxyPROGESTERone (DEPO-PROVERA) 150 MG/ML injection Inject 1 mL (150 mg total) into the muscle once.  1 mL  0  . lisinopril-hydrochlorothiazide (PRINZIDE,ZESTORETIC) 20-25 MG per tablet Take 1 tablet by mouth daily.  30 tablet  3  . meloxicam (MOBIC) 15 MG tablet Take 1 tablet (15 mg total) by mouth daily.  30 tablet  2  . methocarbamol (ROBAXIN) 500 MG tablet Take 1 tablet (500 mg total) by mouth 2 (two) times daily as needed (for muscle relaxer).  60 tablet  2  .  omeprazole (PRILOSEC) 20 MG capsule Take 1 capsule (20 mg total) by mouth daily with breakfast. Take 30 min before breakfast  30 capsule  3   Current Facility-Administered Medications  Medication Dose Route Frequency Provider Last Rate Last Dose  . medroxyPROGESTERone (DEPO-PROVERA) injection 150 mg  150 mg Intramuscular Once Adam Phenix, MD       No family history on file. History   Social History  . Marital Status: Married    Spouse Name: N/A    Number of Children: N/A  . Years of Education: N/A   Social History Main Topics  . Smoking status: Never Smoker   . Smokeless tobacco: None  . Alcohol Use: Yes     Comment: heavy  . Drug Use: No  . Sexual Activity: Not Currently    Birth Control/ Protection: Surgical   Other Topics Concern  . None   Social History Narrative   Financial assistance approved for 100% discount at [facility] and has [facility] card   [employer]  March 30, 2010 3:31 PM   Review of Systems: Constitutional: Denies fever, chills, diaphoresis, appetite change and fatigue.  Respiratory: Denies SOB, DOE, cough, chest tightness, and wheezing. Denies chest pain. Cardiovascular: No chest pain, palpitations and leg swelling.  Gastrointestinal: No abdominal pain, nausea, vomiting, bloody stools Genitourinary: No dysuria, frequency, hematuria, or flank pain.  Psych: No depression symptoms. No SI or SA.  Objective:  Physical Exam: Filed Vitals:   02/26/13 1027  BP: 141/94  Pulse: 79  Temp: 97.7 F (36.5 C)  TempSrc: Oral  Height: 5' 1.5" (1.562 m)  Weight: 314 lb 11.2 oz (142.747 kg)  SpO2: 99%   General: morbidly obese,  No acute distress. Comfortable. HEENT: Normal oral mucosa. MMM.  Lungs: CTA bilaterally. Heart: RRR; no extra sounds or murmurs  Abdomen: Non-distended, normal BS, soft, nontender; no hepatosplenomegaly. Mild suprapubic tenderness. Pelvic exam - deferred.  Extremities: No pedal edema. No appreciable tenderness in the her knees. Normal  ROM. Neurologic: Normal EOM,  Alert and oriented x3. No obvious neurologic/cranial nerve deficits.  Assessment & Plan:  I have discussed my assessment and plan  with  my attending in the clinic, Dr. Dalphine Handing  as detailed under problem based charting.

## 2013-02-26 NOTE — Assessment & Plan Note (Addendum)
Recommended continuing with Mobic. Stop ibuprofen and Tramadol. She reports that she does not wish to return to [facility]. We can consider pain clinic in future.

## 2013-02-26 NOTE — Addendum Note (Signed)
Addended by: Dow Adolph on: 02/26/2013 01:14 PM   Modules accepted: Level of Service

## 2013-03-03 NOTE — Progress Notes (Signed)
Case discussed with Dr.Kazibwe at the time of the visit.  We reviewed the resident's history and exam and pertinent patient test results.  I agree with the assessment, diagnosis, and plan of care documented in the resident's note.    

## 2013-03-04 ENCOUNTER — Other Ambulatory Visit: Payer: Self-pay

## 2013-03-08 ENCOUNTER — Other Ambulatory Visit: Payer: Medicaid Other | Admitting: Obstetrics & Gynecology

## 2013-03-14 ENCOUNTER — Telehealth: Payer: Self-pay | Admitting: Internal Medicine

## 2013-03-14 NOTE — Telephone Encounter (Signed)
[phone] "Stye" over right eye x 3-4 days burning and itching, worse with cold air.  Advised patient to try warm compress if not getting better may make a clinic appt Monday for further options of management.    Shirlee Latch MD

## 2013-04-05 ENCOUNTER — Telehealth: Payer: Self-pay | Admitting: *Deleted

## 2013-04-05 NOTE — Telephone Encounter (Signed)
Pt called nurse line requesting advice on bleeding this month even though on Depo.  Pt request call back.

## 2013-04-06 ENCOUNTER — Encounter: Payer: Medicaid Other | Admitting: Internal Medicine

## 2013-04-06 NOTE — Telephone Encounter (Signed)
Called Carrie Harvey and she states she is due to get her 4th depoprovera soon, but she has started spotting- wanted to know if that was normal. Informed her that yes it was normal and some patients experience spotting/bleeding when next shot is due. We discussed if she begins to have heavier bleeding or more frequent spotting it may be time to discuss with provider if she needs her shots on a different schedule. Glennda voices understanding.

## 2013-04-26 ENCOUNTER — Ambulatory Visit: Payer: Medicaid Other

## 2013-05-03 ENCOUNTER — Ambulatory Visit (INDEPENDENT_AMBULATORY_CARE_PROVIDER_SITE_OTHER): Payer: Medicaid Other | Admitting: *Deleted

## 2013-05-03 VITALS — BP 126/89 | HR 76 | Temp 98.3°F | Wt 320.2 lb

## 2013-05-03 DIAGNOSIS — N949 Unspecified condition associated with female genital organs and menstrual cycle: Secondary | ICD-10-CM

## 2013-05-03 DIAGNOSIS — N938 Other specified abnormal uterine and vaginal bleeding: Secondary | ICD-10-CM

## 2013-05-03 MED ORDER — MEDROXYPROGESTERONE ACETATE 150 MG/ML IM SUSP
150.0000 mg | Freq: Once | INTRAMUSCULAR | Status: AC
  Administered 2013-05-03: 150 mg via INTRAMUSCULAR

## 2013-06-01 ENCOUNTER — Telehealth: Payer: Self-pay | Admitting: *Deleted

## 2013-06-01 NOTE — Telephone Encounter (Signed)
Pt sent a request for an appt, tried both ph#'s with no success, cannot leave vmail

## 2013-06-04 ENCOUNTER — Ambulatory Visit: Payer: Medicaid Other | Admitting: Internal Medicine

## 2013-06-09 ENCOUNTER — Ambulatory Visit: Payer: Medicaid Other | Admitting: Internal Medicine

## 2013-06-15 ENCOUNTER — Encounter: Payer: Medicaid Other | Admitting: Internal Medicine

## 2013-06-27 ENCOUNTER — Other Ambulatory Visit: Payer: Self-pay | Admitting: Internal Medicine

## 2013-07-16 ENCOUNTER — Encounter (HOSPITAL_COMMUNITY): Payer: Self-pay | Admitting: Emergency Medicine

## 2013-07-16 ENCOUNTER — Emergency Department (HOSPITAL_COMMUNITY)
Admission: EM | Admit: 2013-07-16 | Discharge: 2013-07-16 | Disposition: A | Discharge status: Home / Self Care | Payer: Medicaid Other | Attending: Emergency Medicine | Admitting: Emergency Medicine

## 2013-07-16 DIAGNOSIS — Z8711 Personal history of peptic ulcer disease: Secondary | ICD-10-CM | POA: Insufficient documentation

## 2013-07-16 DIAGNOSIS — Z9089 Acquired absence of other organs: Secondary | ICD-10-CM | POA: Insufficient documentation

## 2013-07-16 DIAGNOSIS — K2970 Gastritis, unspecified, without bleeding: Secondary | ICD-10-CM | POA: Insufficient documentation

## 2013-07-16 DIAGNOSIS — K2990 Gastroduodenitis, unspecified, without bleeding: Principal | ICD-10-CM

## 2013-07-16 DIAGNOSIS — Z87448 Personal history of other diseases of urinary system: Secondary | ICD-10-CM | POA: Insufficient documentation

## 2013-07-16 DIAGNOSIS — K219 Gastro-esophageal reflux disease without esophagitis: Secondary | ICD-10-CM | POA: Insufficient documentation

## 2013-07-16 DIAGNOSIS — Z872 Personal history of diseases of the skin and subcutaneous tissue: Secondary | ICD-10-CM | POA: Insufficient documentation

## 2013-07-16 DIAGNOSIS — Z791 Long term (current) use of non-steroidal anti-inflammatories (NSAID): Secondary | ICD-10-CM | POA: Insufficient documentation

## 2013-07-16 DIAGNOSIS — G8929 Other chronic pain: Secondary | ICD-10-CM | POA: Insufficient documentation

## 2013-07-16 DIAGNOSIS — M109 Gout, unspecified: Secondary | ICD-10-CM | POA: Insufficient documentation

## 2013-07-16 DIAGNOSIS — I10 Essential (primary) hypertension: Secondary | ICD-10-CM | POA: Insufficient documentation

## 2013-07-16 DIAGNOSIS — Z79899 Other long term (current) drug therapy: Secondary | ICD-10-CM | POA: Insufficient documentation

## 2013-07-16 LAB — COMPREHENSIVE METABOLIC PANEL
ALK PHOS: 82 U/L (ref 39–117)
ALT: 11 U/L (ref 0–35)
AST: 16 U/L (ref 0–37)
Albumin: 3.3 g/dL — ABNORMAL LOW (ref 3.5–5.2)
BUN: 9 mg/dL (ref 6–23)
CO2: 23 meq/L (ref 19–32)
Calcium: 8.9 mg/dL (ref 8.4–10.5)
Chloride: 103 mEq/L (ref 96–112)
Creatinine, Ser: 0.7 mg/dL (ref 0.50–1.10)
GLUCOSE: 100 mg/dL — AB (ref 70–99)
POTASSIUM: 3.7 meq/L (ref 3.7–5.3)
SODIUM: 140 meq/L (ref 137–147)
Total Bilirubin: 0.4 mg/dL (ref 0.3–1.2)
Total Protein: 7.1 g/dL (ref 6.0–8.3)

## 2013-07-16 LAB — CBC WITH DIFFERENTIAL/PLATELET
BASOS ABS: 0 10*3/uL (ref 0.0–0.1)
Basophils Relative: 0 % (ref 0–1)
Eosinophils Absolute: 0.2 10*3/uL (ref 0.0–0.7)
Eosinophils Relative: 3 % (ref 0–5)
HCT: 36.9 % (ref 36.0–46.0)
Hemoglobin: 12.4 g/dL (ref 12.0–15.0)
LYMPHS ABS: 2.1 10*3/uL (ref 0.7–4.0)
LYMPHS PCT: 25 % (ref 12–46)
MCH: 30.6 pg (ref 26.0–34.0)
MCHC: 33.6 g/dL (ref 30.0–36.0)
MCV: 91.1 fL (ref 78.0–100.0)
Monocytes Absolute: 0.5 10*3/uL (ref 0.1–1.0)
Monocytes Relative: 6 % (ref 3–12)
NEUTROS ABS: 5.5 10*3/uL (ref 1.7–7.7)
NEUTROS PCT: 66 % (ref 43–77)
PLATELETS: 258 10*3/uL (ref 150–400)
RBC: 4.05 MIL/uL (ref 3.87–5.11)
RDW: 13.8 % (ref 11.5–15.5)
WBC: 8.4 10*3/uL (ref 4.0–10.5)

## 2013-07-16 LAB — LIPASE, BLOOD: Lipase: 47 U/L (ref 11–59)

## 2013-07-16 MED ORDER — SODIUM CHLORIDE 0.9 % IV SOLN
1000.0000 mL | Freq: Once | INTRAVENOUS | Status: DC
Start: 2013-07-16 — End: 2013-07-16

## 2013-07-16 MED ORDER — PROCHLORPERAZINE EDISYLATE 5 MG/ML IJ SOLN: 10.0000 mg | Freq: Once | INTRAMUSCULAR | Status: DC

## 2013-07-16 MED ORDER — DICYCLOMINE HCL 20 MG PO TABS: 20.0000 mg | ORAL_TABLET | Freq: Two times a day (BID) | ORAL | Status: DC

## 2013-07-16 MED ORDER — GI COCKTAIL ~~LOC~~
30.0000 mL | Freq: Once | ORAL | Status: AC
  Administered 2013-07-16: 30 mL via ORAL
  Filled 2013-07-16: qty 30

## 2013-07-16 MED ORDER — SUCRALFATE 1 GM/10ML PO SUSP: 1.0000 g | Freq: Three times a day (TID) | ORAL | Status: DC

## 2013-07-16 MED ORDER — LIDOCAINE VISCOUS 2 % MT SOLN: OROMUCOSAL | Status: DC

## 2013-07-16 MED ORDER — SODIUM CHLORIDE 0.9 % IV SOLN: 1000.0000 mL | INTRAVENOUS | Status: DC

## 2013-07-16 MED ORDER — DIPHENHYDRAMINE HCL 50 MG/ML IJ SOLN: 25.0000 mg | Freq: Once | INTRAMUSCULAR | Status: DC

## 2013-07-16 NOTE — Discharge Instructions (Signed)

## 2013-07-16 NOTE — ED Provider Notes (Signed)
CSN: [id number]     Arrival date & time 07/16/13  0747 History   First MD Initiated Contact with Patient 07/16/13 [phone]     Chief Complaint  Patient presents with  . Abdominal Pain     (Consider location/radiation/quality/duration/timing/severity/associated sxs/prior Treatment) HPI  Carrie Harvey Is a 48 year old female with a past medical history of morbid obesity, chronic back pain, hypertension, previous history of gastric ulcers.  GERD.  Patient states that she has been seen here before for the same complaint and usually gets a GI cocktail with relief of her symptoms.  She states that her pain has been worsening over the past week.  She complains of severe post prandial pain.  She denies any nausea or vomiting.  She has had some loose stools.  She is a past history of previous cholecystectomy.  Patient takes Prilosec at home.  She's been using ibuprofen frequently for her pain.  She states she "only drinks a little."  When further questioned this means he drinks approximately one 40 ounce of beer daily.  Patient denies fevers, chills, myalgias or other signs of systemic infection.Last menstrual period was 5 days ago.  Past Medical History  Diagnosis Date  . Boil   . Hypertension   . Arthritis   . GERD (gastroesophageal reflux disease)   . Recurrent knee pain   . Obesity   . Gout   . Renal disorder    Past Surgical History  Procedure Laterality Date  . Tubal ligation    . Cholecystectomy    . Cesarean section     No family history on file. History  Substance Use Topics  . Smoking status: Never Smoker   . Smokeless tobacco: Not on file  . Alcohol Use: Yes     Comment: heavy   OB History   Grav Para Term Preterm Abortions TAB SAB Ect Mult Living                 Review of Systems  Ten systems reviewed and are negative for acute change, except as noted in the HPI.    Allergies  Fish allergy; Iodine; Norco; and Tylenol  Home Medications   Current Outpatient Rx   Name  Route  Sig  Dispense  Refill  . Calcium Carbonate Antacid (TUMS PO)   Oral   Take 8 tablets by mouth daily as needed (upset stomach).         Marland Kitchen ibuprofen (ADVIL,MOTRIN) 200 MG tablet   Oral   Take 400 mg by mouth every 6 (six) hours as needed for headache.         . lisinopril-hydrochlorothiazide (PRINZIDE,ZESTORETIC) 20-25 MG per tablet   Oral   Take 1 tablet by mouth daily.   30 tablet   3   . medroxyPROGESTERone (DEPO-PROVERA) 150 MG/ML injection   Intramuscular   Inject 1 mL (150 mg total) into the muscle once.   1 mL   0   . meloxicam (MOBIC) 15 MG tablet   Oral   Take 15 mg by mouth daily.         . methocarbamol (ROBAXIN) 500 MG tablet   Oral   Take 500 mg by mouth 2 (two) times daily as needed for muscle spasms.         Marland Kitchen omeprazole (PRILOSEC) 20 MG capsule   Oral   Take 1 capsule (20 mg total) by mouth daily with breakfast. Take 30 min before breakfast   30 capsule   3  BP 189/108  Pulse 85  Temp(Src) 98.1 F (36.7 C) (Oral)  Resp 22  Ht 5\' 1"  (1.549 m)  Wt 329 lb 7 oz (149.432 kg)  BMI 62.28 kg/m2  SpO2 100%  LMP 07/12/2013 Physical Exam  Constitutional: She is oriented to person, place, and time. She appears well-developed and well-nourished. No distress.  HENT:  Head: Normocephalic and atraumatic.  Eyes: Conjunctivae are normal. No scleral icterus.  Neck: Normal range of motion.  Cardiovascular: Normal rate, regular rhythm and normal heart sounds.  Exam reveals no gallop and no friction rub.   No murmur heard. Pulmonary/Chest: Effort normal and breath sounds normal. No respiratory distress.  Abdominal: Soft. Bowel sounds are normal. She exhibits no distension and no mass. There is tenderness (epigastric tenderness). There is no guarding.  Neurological: She is alert and oriented to person, place, and time.  Skin: Skin is warm and dry. She is not diaphoretic.    ED Course  Procedures (including critical care time) Labs  Review Labs Reviewed  COMPREHENSIVE METABOLIC PANEL - Abnormal; Notable for the following:    Glucose, Bld 100 (*)    Albumin 3.3 (*)    All other components within normal limits  CBC WITH DIFFERENTIAL  LIPASE, BLOOD   Imaging Review No results found.   EKG Interpretation None      MDM   Final diagnoses:  Gastritis   Labs show mild hyperglycemia and hypoalbuminemia. I do not feel these have any clinical significance.  Patient's sxs relieved with GI cocktail. No suspicion for ACS.  Suspect gastritis vs. Gastric ulcer. Patient has f/u with internal medicine. Recommend further GI work up. Avoid etoh, nsaids, spicy foods and caffeine. Discussed with paitient before discharge and return precautions given.    Margarita Mail, PA-C 07/18/13 2126

## 2013-07-16 NOTE — ED Notes (Signed)
Pt c/o abdominal pain with diarrhea ongoing x 5 days. Pt reports that she was given omeprazole for her stomach but it is not helping. Pt also tried methocarbam which is prescribed for her back.

## 2013-07-19 ENCOUNTER — Encounter: Payer: Self-pay | Admitting: Gastroenterology

## 2013-07-19 ENCOUNTER — Ambulatory Visit: Payer: Medicaid Other

## 2013-07-19 NOTE — ED Provider Notes (Signed)
Medical screening examination/treatment/procedure(s) were performed by non-physician practitioner and as supervising physician I was immediately available for consultation/collaboration.   EKG Interpretation None       Jasper Riling. Alvino Chapel, [place] 07/19/13 [phone]

## 2013-07-23 ENCOUNTER — Ambulatory Visit: Payer: Medicaid Other | Admitting: Internal Medicine

## 2013-07-28 ENCOUNTER — Ambulatory Visit: Payer: Medicaid Other

## 2013-07-29 ENCOUNTER — Ambulatory Visit: Payer: Medicaid Other | Admitting: Internal Medicine

## 2013-08-16 ENCOUNTER — Encounter: Payer: Self-pay | Admitting: Internal Medicine

## 2013-08-16 ENCOUNTER — Encounter: Payer: Medicaid Other | Admitting: Internal Medicine

## 2013-09-03 ENCOUNTER — Ambulatory Visit: Payer: Medicaid Other | Admitting: Gastroenterology

## 2013-09-10 ENCOUNTER — Other Ambulatory Visit: Payer: Self-pay | Admitting: Internal Medicine

## 2013-09-10 NOTE — Progress Notes (Signed)
Patient needs the following health maintenance updated at next [facility] clinic follow up: -Pap smear -Tdap  Signed: Corky Sox, MD 09/10/2013 10:26 AM

## 2013-09-23 ENCOUNTER — Encounter: Payer: Self-pay | Admitting: Internal Medicine

## 2013-09-23 ENCOUNTER — Ambulatory Visit: Payer: Medicaid Other | Admitting: Internal Medicine

## 2013-09-30 ENCOUNTER — Other Ambulatory Visit: Payer: Self-pay | Admitting: *Deleted

## 2013-09-30 DIAGNOSIS — I10 Essential (primary) hypertension: Secondary | ICD-10-CM

## 2013-09-30 DIAGNOSIS — M1990 Unspecified osteoarthritis, unspecified site: Secondary | ICD-10-CM

## 2013-10-04 MED ORDER — MELOXICAM 15 MG PO TABS: 15.0000 mg | ORAL_TABLET | Freq: Every day | ORAL | Status: DC

## 2013-10-04 MED ORDER — METHOCARBAMOL 500 MG PO TABS: 500.0000 mg | ORAL_TABLET | Freq: Two times a day (BID) | ORAL | Status: DC | PRN

## 2013-10-04 MED ORDER — OMEPRAZOLE 20 MG PO CPDR: 20.0000 mg | DELAYED_RELEASE_CAPSULE | Freq: Every day | ORAL | Status: DC

## 2013-10-04 MED ORDER — LISINOPRIL-HYDROCHLOROTHIAZIDE 20-25 MG PO TABS: 1.0000 | ORAL_TABLET | Freq: Every day | ORAL | Status: DC

## 2013-10-08 ENCOUNTER — Emergency Department (HOSPITAL_COMMUNITY)
Admission: EM | Admit: 2013-10-08 | Discharge: 2013-10-08 | Disposition: A | Discharge status: Home / Self Care | Payer: Self-pay | Attending: Emergency Medicine | Admitting: Emergency Medicine

## 2013-10-08 ENCOUNTER — Emergency Department (HOSPITAL_COMMUNITY): Payer: Medicaid Other

## 2013-10-08 ENCOUNTER — Encounter (HOSPITAL_COMMUNITY): Payer: Self-pay | Admitting: Emergency Medicine

## 2013-10-08 DIAGNOSIS — Z9851 Tubal ligation status: Secondary | ICD-10-CM | POA: Insufficient documentation

## 2013-10-08 DIAGNOSIS — Z87448 Personal history of other diseases of urinary system: Secondary | ICD-10-CM | POA: Insufficient documentation

## 2013-10-08 DIAGNOSIS — Z8742 Personal history of other diseases of the female genital tract: Secondary | ICD-10-CM | POA: Insufficient documentation

## 2013-10-08 DIAGNOSIS — A499 Bacterial infection, unspecified: Secondary | ICD-10-CM | POA: Insufficient documentation

## 2013-10-08 DIAGNOSIS — B9689 Other specified bacterial agents as the cause of diseases classified elsewhere: Secondary | ICD-10-CM | POA: Insufficient documentation

## 2013-10-08 DIAGNOSIS — Z9089 Acquired absence of other organs: Secondary | ICD-10-CM | POA: Insufficient documentation

## 2013-10-08 DIAGNOSIS — Z9889 Other specified postprocedural states: Secondary | ICD-10-CM | POA: Insufficient documentation

## 2013-10-08 DIAGNOSIS — Z79899 Other long term (current) drug therapy: Secondary | ICD-10-CM | POA: Insufficient documentation

## 2013-10-08 DIAGNOSIS — K219 Gastro-esophageal reflux disease without esophagitis: Secondary | ICD-10-CM | POA: Insufficient documentation

## 2013-10-08 DIAGNOSIS — M109 Gout, unspecified: Secondary | ICD-10-CM | POA: Insufficient documentation

## 2013-10-08 DIAGNOSIS — N390 Urinary tract infection, site not specified: Secondary | ICD-10-CM | POA: Insufficient documentation

## 2013-10-08 DIAGNOSIS — Z872 Personal history of diseases of the skin and subcutaneous tissue: Secondary | ICD-10-CM | POA: Insufficient documentation

## 2013-10-08 DIAGNOSIS — E669 Obesity, unspecified: Secondary | ICD-10-CM | POA: Insufficient documentation

## 2013-10-08 DIAGNOSIS — N760 Acute vaginitis: Secondary | ICD-10-CM | POA: Insufficient documentation

## 2013-10-08 HISTORY — DX: Benign neoplasm of connective and other soft tissue, unspecified: D21.9

## 2013-10-08 LAB — WET PREP, GENITAL
TRICH WET PREP: NONE SEEN
WBC, Wet Prep HPF POC: NONE SEEN
Yeast Wet Prep HPF POC: NONE SEEN

## 2013-10-08 LAB — COMPREHENSIVE METABOLIC PANEL
ALT: 8 U/L (ref 0–35)
AST: 13 U/L (ref 0–37)
Albumin: 3.4 g/dL — ABNORMAL LOW (ref 3.5–5.2)
Alkaline Phosphatase: 88 U/L (ref 39–117)
BUN: 6 mg/dL (ref 6–23)
CALCIUM: 8.7 mg/dL (ref 8.4–10.5)
CO2: 25 mEq/L (ref 19–32)
CREATININE: 0.62 mg/dL (ref 0.50–1.10)
Chloride: 104 mEq/L (ref 96–112)
GFR calc Af Amer: >90 mL/min (ref >90)
Glucose, Bld: 98 mg/dL (ref 70–99)
Potassium: 3.6 mEq/L — ABNORMAL LOW (ref 3.7–5.3)
Sodium: 142 mEq/L (ref 137–147)
TOTAL PROTEIN: 7.1 g/dL (ref 6.0–8.3)
Total Bilirubin: 0.5 mg/dL (ref 0.3–1.2)

## 2013-10-08 LAB — CBC WITH DIFFERENTIAL/PLATELET
Basophils Absolute: 0 10*3/uL (ref 0.0–0.1)
Basophils Relative: 0 % (ref 0–1)
EOS ABS: 0.2 10*3/uL (ref 0.0–0.7)
Eosinophils Relative: 2 % (ref 0–5)
HEMATOCRIT: 40.1 % (ref 36.0–46.0)
Hemoglobin: 13.1 g/dL (ref 12.0–15.0)
Lymphocytes Relative: 19 % (ref 12–46)
Lymphs Abs: 1.8 10*3/uL (ref 0.7–4.0)
MCH: 30.5 pg (ref 26.0–34.0)
MCHC: 32.7 g/dL (ref 30.0–36.0)
MCV: 93.3 fL (ref 78.0–100.0)
MONO ABS: 0.7 10*3/uL (ref 0.1–1.0)
Monocytes Relative: 7 % (ref 3–12)
Neutro Abs: 7 10*3/uL (ref 1.7–7.7)
Neutrophils Relative %: 72 % (ref 43–77)
PLATELETS: 224 10*3/uL (ref 150–400)
RBC: 4.3 MIL/uL (ref 3.87–5.11)
RDW: 13 % (ref 11.5–15.5)
WBC: 9.7 10*3/uL (ref 4.0–10.5)

## 2013-10-08 LAB — URINALYSIS, ROUTINE W REFLEX MICROSCOPIC
BILIRUBIN URINE: NEGATIVE
Glucose, UA: NEGATIVE mg/dL
KETONES UR: NEGATIVE mg/dL
Nitrite: NEGATIVE
PROTEIN: 100 mg/dL — AB
SPECIFIC GRAVITY, URINE: 1.015 (ref 1.005–1.030)
Urobilinogen, UA: 1 mg/dL (ref 0.0–1.0)
pH: 6 (ref 5.0–8.0)

## 2013-10-08 LAB — URINE MICROSCOPIC-ADD ON

## 2013-10-08 LAB — POC URINE PREG, ED: PREG TEST UR: NEGATIVE

## 2013-10-08 LAB — LIPASE, BLOOD: Lipase: 17 U/L (ref 11–59)

## 2013-10-08 MED ORDER — OXYCODONE HCL 5 MG PO TABS
5.0000 mg | ORAL_TABLET | Freq: Once | ORAL | Status: AC
  Administered 2013-10-08: 5 mg via ORAL
  Filled 2013-10-08: qty 1

## 2013-10-08 MED ORDER — OXYCODONE HCL 5 MG PO TABS: 5.0000 mg | ORAL_TABLET | Freq: Four times a day (QID) | ORAL | Status: DC | PRN

## 2013-10-08 MED ORDER — MORPHINE SULFATE 4 MG/ML IJ SOLN
4.0000 mg | Freq: Once | INTRAMUSCULAR | Status: AC
  Administered 2013-10-08: 4 mg via INTRAVENOUS
  Filled 2013-10-08: qty 1

## 2013-10-08 MED ORDER — OXYCODONE-ACETAMINOPHEN 5-325 MG PO TABS: 1.0000 | ORAL_TABLET | Freq: Three times a day (TID) | ORAL | Status: DC | PRN

## 2013-10-08 MED ORDER — OXYCODONE-ACETAMINOPHEN 5-325 MG PO TABS: 1.0000 | ORAL_TABLET | Freq: Once | ORAL | Status: DC

## 2013-10-08 MED ORDER — METRONIDAZOLE 500 MG PO TABS: 500.0000 mg | ORAL_TABLET | Freq: Two times a day (BID) | ORAL | Status: DC

## 2013-10-08 MED ORDER — CEPHALEXIN 500 MG PO CAPS: 500.0000 mg | ORAL_CAPSULE | Freq: Four times a day (QID) | ORAL | Status: DC

## 2013-10-08 NOTE — Discharge Instructions (Signed)
Bacterial Vaginosis Bacterial vaginosis is a vaginal infection that occurs when the normal balance of bacteria in the vagina is disrupted. It results from an overgrowth of certain bacteria. This is the most common vaginal infection in women of childbearing age. Treatment is important to prevent complications, especially in pregnant women, as it can cause a premature delivery. CAUSES  Bacterial vaginosis is caused by an increase in harmful bacteria that are normally present in smaller amounts in the vagina. Several different kinds of bacteria can cause bacterial vaginosis. However, the reason that the condition develops is not fully understood. RISK FACTORS Certain activities or behaviors can put you at an increased risk of developing bacterial vaginosis, including:  Having a new sex partner or multiple sex partners.  Douching.  Using an intrauterine device (IUD) for contraception. Women do not get bacterial vaginosis from toilet seats, bedding, swimming pools, or contact with objects around them. SIGNS AND SYMPTOMS  Some women with bacterial vaginosis have no signs or symptoms. Common symptoms include:  Grey vaginal discharge.  A fishlike odor with discharge, especially after sexual intercourse.  Itching or burning of the vagina and vulva.  Burning or pain with urination. DIAGNOSIS  Your health care provider will take a medical history and examine the vagina for signs of bacterial vaginosis. A sample of vaginal fluid may be taken. Your health care provider will look at this sample under a microscope to check for bacteria and abnormal cells. A vaginal pH test may also be done.  TREATMENT  Bacterial vaginosis may be treated with antibiotic medicines. These may be given in the form of a pill or a vaginal cream. A second round of antibiotics may be prescribed if the condition comes back after treatment.  HOME CARE INSTRUCTIONS   Only take over-the-counter or prescription medicines as  directed by your health care provider.  If antibiotic medicine was prescribed, take it as directed. Make sure you finish it even if you start to feel better.  Do not have sex until treatment is completed.  Tell all sexual partners that you have a vaginal infection. They should see their health care provider and be treated if they have problems, such as a mild rash or itching.  Practice safe sex by using condoms and only having one sex partner. SEEK MEDICAL CARE IF:   Your symptoms are not improving after 3 days of treatment.  You have increased discharge or pain.  You have a fever. MAKE SURE YOU:   Understand these instructions.  Will watch your condition.  Will get help right away if you are not doing well or get worse. FOR MORE INFORMATION  Centers for Disease Control and Prevention, Division of STD Prevention: [employer]raud[employer] American Sexual Health Association (ASHA): www.ashastd.org  Document Released: 04/15/2005 Document Revised: 02/03/2013 Document Reviewed: 11/25/2012 [facility] Patient Information 2014 [place]. Urinary Tract Infection Urinary tract infections (UTIs) can develop anywhere along your urinary tract. Your urinary tract is your body's drainage system for removing wastes and extra water. Your urinary tract includes two kidneys, two ureters, a bladder, and a urethra. Your kidneys are a pair of bean-shaped organs. Each kidney is about the size of your fist. They are located below your ribs, one on each side of your spine. CAUSES Infections are caused by microbes, which are microscopic organisms, including fungi, viruses, and bacteria. These organisms are so small that they can only be seen through a microscope. Bacteria are the microbes that most commonly cause UTIs. SYMPTOMS  Symptoms of UTIs  may vary by age and gender of the patient and by the location of the infection. Symptoms in young women typically include a frequent and intense urge to urinate and a  painful, burning feeling in the bladder or urethra during urination. Older women and men are more likely to be tired, shaky, and weak and have muscle aches and abdominal pain. A fever may mean the infection is in your kidneys. Other symptoms of a kidney infection include pain in your back or sides below the ribs, nausea, and vomiting. DIAGNOSIS To diagnose a UTI, your caregiver will ask you about your symptoms. Your caregiver also will ask to provide a urine sample. The urine sample will be tested for bacteria and white blood cells. White blood cells are made by your body to help fight infection. TREATMENT  Typically, UTIs can be treated with medication. Because most UTIs are caused by a bacterial infection, they usually can be treated with the use of antibiotics. The choice of antibiotic and length of treatment depend on your symptoms and the type of bacteria causing your infection. HOME CARE INSTRUCTIONS  If you were prescribed antibiotics, take them exactly as your caregiver instructs you. Finish the medication even if you feel better after you have only taken some of the medication.  Drink enough water and fluids to keep your urine clear or pale yellow.  Avoid caffeine, tea, and carbonated beverages. They tend to irritate your bladder.  Empty your bladder often. Avoid holding urine for long periods of time.  Empty your bladder before and after sexual intercourse.  After a bowel movement, women should cleanse from front to back. Use each tissue only once. SEEK MEDICAL CARE IF:   You have back pain.  You develop a fever.  Your symptoms do not begin to resolve within 3 days. SEEK IMMEDIATE MEDICAL CARE IF:   You have severe back pain or lower abdominal pain.  You develop chills.  You have nausea or vomiting.  You have continued burning or discomfort with urination. MAKE SURE YOU:   Understand these instructions.  Will watch your condition.  Will get help right away if you are  not doing well or get worse. Document Released: 01/23/2005 Document Revised: 10/15/2011 Document Reviewed: 05/24/2011 [facility] Patient Information 2014 [place].

## 2013-10-08 NOTE — ED Provider Notes (Signed)
CSN: [id number]     Arrival date & time 10/08/13  1051 History   First MD Initiated Contact with Patient 10/08/13 1106     Chief Complaint  Patient presents with  . Abdominal Pain     (Consider location/radiation/quality/duration/timing/severity/associated sxs/prior Treatment) HPI Comments: Patient presents to the emergency department with chief complaint of left-sided pelvic pain. She states that she has had the pain for the past 2 days. She states it is constant. She states that it is progressively worsening. She states that she was recently diagnosed with fibroids by her obstetrician, but there were small and concerning. She denies fevers, chills, nausea, vomiting, dysuria, or vaginal discharge. She denies history of kidney stones. Past surgical history remarkable for tubal ligation, and cholecystectomy.  The history is provided by the patient. No language interpreter was used.    Past Medical History  Diagnosis Date  . Boil   . Hypertension   . Arthritis   . GERD (gastroesophageal reflux disease)   . Recurrent knee pain   . Obesity   . Gout   . Renal disorder   . Fibroid    Past Surgical History  Procedure Laterality Date  . Tubal ligation    . Cholecystectomy    . Cesarean section     No family history on file. History  Substance Use Topics  . Smoking status: Never Smoker   . Smokeless tobacco: Not on file  . Alcohol Use: No     Comment: "not lately"   OB History   Grav Para Term Preterm Abortions TAB SAB Ect Mult Living                 Review of Systems  All other systems reviewed and are negative.     Allergies  Fish allergy; Iodine; Norco; and Tylenol  Home Medications   Prior to Admission medications   Medication Sig Start Date End Date Taking? Authorizing Provider  Calcium Carbonate Antacid (TUMS PO) Take 8 tablets by mouth daily as needed (upset stomach).   Yes Historical Provider, MD  dicyclomine (BENTYL) 20 MG tablet Take 1 tablet (20 mg total)  by mouth 2 (two) times daily. 07/16/13  Yes Margarita Mail, PA-C  ibuprofen (ADVIL,MOTRIN) 200 MG tablet Take 400 mg by mouth every 6 (six) hours as needed for headache.   Yes Historical Provider, MD  lisinopril-hydrochlorothiazide (PRINZIDE,ZESTORETIC) 20-25 MG per tablet Take 1 tablet by mouth daily.   Yes Corky Sox, MD  omeprazole (PRILOSEC) 20 MG capsule Take 1 capsule (20 mg total) by mouth daily with breakfast. Take 30 min before breakfast   Yes Corky Sox, MD  sucralfate (CARAFATE) 1 GM/10ML suspension Take 10 mLs (1 g total) by mouth 4 (four) times daily -  with meals and at bedtime. 07/16/13  Yes Abigail Harris, PA-C   BP 155/76  Pulse 77  Temp(Src) 97.7 F (36.5 C) (Oral)  Resp 16  SpO2 98%  LMP 08/08/2013 Physical Exam  Nursing note and vitals reviewed. Constitutional: She is oriented to person, place, and time. She appears well-developed and well-nourished.  HENT:  Head: Normocephalic and atraumatic.  Eyes: Conjunctivae and EOM are normal. Pupils are equal, round, and reactive to light.  Neck: Normal range of motion. Neck supple.  Cardiovascular: Normal rate and regular rhythm.  Exam reveals no gallop and no friction rub.   No murmur heard. Pulmonary/Chest: Effort normal and breath sounds normal. No respiratory distress. She has no wheezes. She has no rales. She exhibits  no tenderness.  Abdominal: Soft. Bowel sounds are normal. She exhibits no distension and no mass. There is no tenderness. There is no rebound and no guarding.  Left lower quadrant moderately tender to palpation, no right lower quadrant tenderness, no upper abdominal tenderness  Genitourinary:  Pelvic exam chaperoned by female ER tech, moderate left adnexal tenderness, no right adnexal tenderness, no uterine tenderness, moderate vaginal discharge, no bleeding, no CMT or friability, no foreign body, no injury to the external genitalia, no other significant findings   Musculoskeletal: Normal range of motion.  She exhibits no edema and no tenderness.  Neurological: She is alert and oriented to person, place, and time.  Skin: Skin is warm and dry.  Psychiatric: She has a normal mood and affect. Her behavior is normal. Judgment and thought content normal.    ED Course  Procedures (including critical care time) Results for orders placed during the hospital encounter of 10/08/13  WET PREP, GENITAL      Result Value Ref Range   Yeast Wet Prep HPF POC NONE SEEN  NONE SEEN   Trich, Wet Prep NONE SEEN  NONE SEEN   Clue Cells Wet Prep HPF POC MODERATE (*) NONE SEEN   WBC, Wet Prep HPF POC NONE SEEN  NONE SEEN  URINALYSIS, ROUTINE W REFLEX MICROSCOPIC      Result Value Ref Range   Color, Urine YELLOW  YELLOW   APPearance CLOUDY (*) CLEAR   Specific Gravity, Urine 1.015  1.005 - 1.030   pH 6.0  5.0 - 8.0   Glucose, UA NEGATIVE  NEGATIVE mg/dL   Hgb urine dipstick MODERATE (*) NEGATIVE   Bilirubin Urine NEGATIVE  NEGATIVE   Ketones, ur NEGATIVE  NEGATIVE mg/dL   Protein, ur 100 (*) NEGATIVE mg/dL   Urobilinogen, UA 1.0  0.0 - 1.0 mg/dL   Nitrite NEGATIVE  NEGATIVE   Leukocytes, UA SMALL (*) NEGATIVE  CBC WITH DIFFERENTIAL      Result Value Ref Range   WBC 9.7  4.0 - 10.5 K/uL   RBC 4.30  3.87 - 5.11 MIL/uL   Hemoglobin 13.1  12.0 - 15.0 g/dL   HCT 40.1  36.0 - 46.0 %   MCV 93.3  78.0 - 100.0 fL   MCH 30.5  26.0 - 34.0 pg   MCHC 32.7  30.0 - 36.0 g/dL   RDW 13.0  11.5 - 15.5 %   Platelets 224  150 - 400 K/uL   Neutrophils Relative % 72  43 - 77 %   Neutro Abs 7.0  1.7 - 7.7 K/uL   Lymphocytes Relative 19  12 - 46 %   Lymphs Abs 1.8  0.7 - 4.0 K/uL   Monocytes Relative 7  3 - 12 %   Monocytes Absolute 0.7  0.1 - 1.0 K/uL   Eosinophils Relative 2  0 - 5 %   Eosinophils Absolute 0.2  0.0 - 0.7 K/uL   Basophils Relative 0  0 - 1 %   Basophils Absolute 0.0  0.0 - 0.1 K/uL  COMPREHENSIVE METABOLIC PANEL      Result Value Ref Range   Sodium 142  137 - 147 mEq/L   Potassium 3.6 (*) 3.7 -  5.3 mEq/L   Chloride 104  96 - 112 mEq/L   CO2 25  19 - 32 mEq/L   Glucose, Bld 98  70 - 99 mg/dL   BUN 6  6 - 23 mg/dL   Creatinine, Ser 0.62  0.50 - 1.10  mg/dL   Calcium 8.7  8.4 - 10.5 mg/dL   Total Protein 7.1  6.0 - 8.3 g/dL   Albumin 3.4 (*) 3.5 - 5.2 g/dL   AST 13  0 - 37 U/L   ALT 8  0 - 35 U/L   Alkaline Phosphatase 88  39 - 117 U/L   Total Bilirubin 0.5  0.3 - 1.2 mg/dL   GFR calc non Af Amer >90  >90 mL/min   GFR calc Af Amer >90  >90 mL/min  LIPASE, BLOOD      Result Value Ref Range   Lipase 17  11 - 59 U/L  URINE MICROSCOPIC-ADD ON      Result Value Ref Range   Squamous Epithelial / LPF FEW (*) RARE   WBC, UA 21-50  <3 WBC/hpf   RBC / HPF 11-20  <3 RBC/hpf   Bacteria, UA MANY (*) RARE   Urine-Other MUCOUS PRESENT    POC URINE PREG, ED      Result Value Ref Range   Preg Test, Ur NEGATIVE  NEGATIVE   US Transvaginal Non-ob  10/08/2013   CLINICAL DATA:  Left adnexal tenderness.  Question torsion.  EXAM: TRANSABDOMINAL AND TRANSVAGINAL ULTRASOUND OF PELVIS  DOPPLER ULTRASOUND OF OVARIES  TECHNIQUE: Both transabdominal and transvaginal ultrasound examinations of the pelvis were performed. Transabdominal technique was performed for global imaging of the pelvis including uterus, ovaries, adnexal regions, and pelvic cul-de-sac.  It was necessary to proceed with endovaginal exam following the transabdominal exam to visualize the ovaries. Color and duplex Doppler ultrasound was utilized to evaluate blood flow to the ovaries.  COMPARISON:  02/02/2013  FINDINGS: Uterus  Measurements: 7.7 x 3.6 x 4.9 cm. 11 mm hypoechoic area within the posterior myometrium compatible with small fibroid.  Endometrium  Thickness: 3 mm in thickness.  No focal abnormality visualized.  Right ovary  Measurements: Not visualized. No adnexal masses.  Left ovary  Measurements: Not visualized. No adnexal masses seen.  Other findings  No free fluid.  The study was complicated by body habitus.  IMPRESSION: Unable  to visualize the ovaries bilaterally. No adnexal masses seen.  Small posterior intramural fibroid.   Electronically Signed   By: Rolm Baptise M.D.   On: 10/08/2013 15:13   US Pelvis Complete  10/08/2013   CLINICAL DATA:  Left adnexal tenderness.  Question torsion.  EXAM: TRANSABDOMINAL AND TRANSVAGINAL ULTRASOUND OF PELVIS  DOPPLER ULTRASOUND OF OVARIES  TECHNIQUE: Both transabdominal and transvaginal ultrasound examinations of the pelvis were performed. Transabdominal technique was performed for global imaging of the pelvis including uterus, ovaries, adnexal regions, and pelvic cul-de-sac.  It was necessary to proceed with endovaginal exam following the transabdominal exam to visualize the ovaries. Color and duplex Doppler ultrasound was utilized to evaluate blood flow to the ovaries.  COMPARISON:  02/02/2013  FINDINGS: Uterus  Measurements: 7.7 x 3.6 x 4.9 cm. 11 mm hypoechoic area within the posterior myometrium compatible with small fibroid.  Endometrium  Thickness: 3 mm in thickness.  No focal abnormality visualized.  Right ovary  Measurements: Not visualized. No adnexal masses.  Left ovary  Measurements: Not visualized. No adnexal masses seen.  Other findings  No free fluid.  The study was complicated by body habitus.  IMPRESSION: Unable to visualize the ovaries bilaterally. No adnexal masses seen.  Small posterior intramural fibroid.   Electronically Signed   By: Rolm Baptise M.D.   On: 10/08/2013 15:13   [place] Art/ven Flow Abd Pelv Doppler  10/08/2013  CLINICAL DATA:  Left adnexal tenderness.  Question torsion.  EXAM: TRANSABDOMINAL AND TRANSVAGINAL ULTRASOUND OF PELVIS  DOPPLER ULTRASOUND OF OVARIES  TECHNIQUE: Both transabdominal and transvaginal ultrasound examinations of the pelvis were performed. Transabdominal technique was performed for global imaging of the pelvis including uterus, ovaries, adnexal regions, and pelvic cul-de-sac.  It was necessary to proceed with endovaginal exam following the  transabdominal exam to visualize the ovaries. Color and duplex Doppler ultrasound was utilized to evaluate blood flow to the ovaries.  COMPARISON:  02/02/2013  FINDINGS: Uterus  Measurements: 7.7 x 3.6 x 4.9 cm. 11 mm hypoechoic area within the posterior myometrium compatible with small fibroid.  Endometrium  Thickness: 3 mm in thickness.  No focal abnormality visualized.  Right ovary  Measurements: Not visualized. No adnexal masses.  Left ovary  Measurements: Not visualized. No adnexal masses seen.  Other findings  No free fluid.  The study was complicated by body habitus.  IMPRESSION: Unable to visualize the ovaries bilaterally. No adnexal masses seen.  Small posterior intramural fibroid.   Electronically Signed   By: Rolm Baptise M.D.   On: 10/08/2013 15:13      EKG Interpretation None      MDM   Final diagnoses:  UTI (lower urinary tract infection)  BV (bacterial vaginosis)    Patient with left-sided pelvic pain. Check labs, urinalysis, and will reevaluate.  UA is remarkable for WBCs.  Patient also has urinary frequency, but no dysuria.  I do suspect that the patient's symptoms are UTI, but she also has some left adnexal tenderness.  Will order ultrasound.  3:34 PM Ultrasound is negative. Will treat UTI and bacterial vaginosis. Patient understands and agrees with plan. She is well appearing. Not in apparent distress. Discharge to home with PCP followup. Patient understands and agrees with plan. She is stable and ready for discharge.    Montine Circle, PA-C 10/08/13 1535

## 2013-10-08 NOTE — ED Provider Notes (Signed)
Medical screening examination/treatment/procedure(s) were performed by non-physician practitioner and as supervising physician I was immediately available for consultation/collaboration.   EKG Interpretation None        Blanchie Dessert, MD 10/08/13 1610

## 2013-10-08 NOTE — ED Notes (Signed)
Pt brought back to room; pt getting undressed and into a gown at this time; Amy, NT (nurse intern) present in room

## 2013-10-08 NOTE — ED Notes (Signed)
Pt c/o left lower abdominal pain around ovaries x 2 days that is constant. Recently diagnosed with Fibroid by OB and stated that it was small and they weren't concerned. Stated that its a sharp constant pain. Denies any bleeding, nausea, vomiting or burning with urination. Pt stated that she can take a sip of water and she urinates a lot after just a sip.

## 2013-10-09 LAB — GC/CHLAMYDIA PROBE AMP
CT PROBE, AMP APTIMA: NEGATIVE
GC Probe RNA: NEGATIVE

## 2013-10-11 ENCOUNTER — Telehealth: Payer: Self-pay | Admitting: Licensed Clinical Social Worker

## 2013-10-11 NOTE — Telephone Encounter (Signed)
CSW placed call to pt to inquire about no shows and potential barriers.  Pt states transportation is an issues.  Ms. Leaks was unaware of Medicaid medical transportation.  Pt states she was seen in the ED and is in need of a follow up appointment in one week at our office.  Pt in agreement for CSW to forward to front office for scheduling.  Once scheduled, CSW will assist with transportation arrangement with [employer].  Address and phone number confirmed.  Letter mailed with information.

## 2013-10-12 NOTE — Discharge Planning (Signed)
[facility] was not able to see patient, [facility] orange card information and resource guide will be mailed to the address listed.

## 2013-10-20 ENCOUNTER — Telehealth: Payer: Self-pay | Admitting: Licensed Clinical Social Worker

## 2013-10-20 NOTE — Telephone Encounter (Signed)
CSW placed call to offer assistance with transportation for Carrie Harvey's upcoming appointment.  Pt states her medicaid was recently stopped and needs to apply for the [facility]  CSW provided Carrie Harvey with financial counselor contact information to schedule appt and inquire about documentation.  CSW will assist with transportation for the 7/2 appt.

## 2013-10-25 NOTE — Telephone Encounter (Signed)
CSW faxed [facility] transportation request for July 1st appt.  Pick up 8:10-8:50 am.

## 2013-10-26 NOTE — Telephone Encounter (Signed)
Error in prior documentation.  Transportation scheduled for 7/2 appt.  Phone number on chart unable to accept calls at this time. Emergency contact doe not have a phone number for Carrie Harvey.  Email on chart.  CSW utilized email address to provide pick up time for transportation on 7/2.

## 2013-10-28 ENCOUNTER — Ambulatory Visit: Payer: Medicaid Other | Admitting: Internal Medicine

## 2013-10-28 ENCOUNTER — Ambulatory Visit: Payer: Medicaid Other

## 2013-12-04 ENCOUNTER — Emergency Department (HOSPITAL_COMMUNITY): Payer: Medicaid Other

## 2013-12-04 ENCOUNTER — Encounter (HOSPITAL_COMMUNITY): Payer: Self-pay | Admitting: Emergency Medicine

## 2013-12-04 ENCOUNTER — Emergency Department (HOSPITAL_COMMUNITY)
Admission: EM | Admit: 2013-12-04 | Discharge: 2013-12-04 | Disposition: A | Discharge status: Home / Self Care | Payer: Medicaid Other | Attending: Emergency Medicine | Admitting: Emergency Medicine

## 2013-12-04 DIAGNOSIS — R109 Unspecified abdominal pain: Secondary | ICD-10-CM | POA: Insufficient documentation

## 2013-12-04 DIAGNOSIS — Z79899 Other long term (current) drug therapy: Secondary | ICD-10-CM | POA: Insufficient documentation

## 2013-12-04 DIAGNOSIS — E669 Obesity, unspecified: Secondary | ICD-10-CM | POA: Insufficient documentation

## 2013-12-04 DIAGNOSIS — Z8742 Personal history of other diseases of the female genital tract: Secondary | ICD-10-CM | POA: Insufficient documentation

## 2013-12-04 DIAGNOSIS — R112 Nausea with vomiting, unspecified: Secondary | ICD-10-CM | POA: Insufficient documentation

## 2013-12-04 DIAGNOSIS — N23 Unspecified renal colic: Secondary | ICD-10-CM | POA: Insufficient documentation

## 2013-12-04 DIAGNOSIS — K219 Gastro-esophageal reflux disease without esophagitis: Secondary | ICD-10-CM | POA: Insufficient documentation

## 2013-12-04 DIAGNOSIS — Z8739 Personal history of other diseases of the musculoskeletal system and connective tissue: Secondary | ICD-10-CM | POA: Insufficient documentation

## 2013-12-04 DIAGNOSIS — Z87448 Personal history of other diseases of urinary system: Secondary | ICD-10-CM | POA: Insufficient documentation

## 2013-12-04 DIAGNOSIS — Z872 Personal history of diseases of the skin and subcutaneous tissue: Secondary | ICD-10-CM | POA: Insufficient documentation

## 2013-12-04 DIAGNOSIS — Z8542 Personal history of malignant neoplasm of other parts of uterus: Secondary | ICD-10-CM | POA: Insufficient documentation

## 2013-12-04 DIAGNOSIS — I10 Essential (primary) hypertension: Secondary | ICD-10-CM | POA: Insufficient documentation

## 2013-12-04 DIAGNOSIS — Z3202 Encounter for pregnancy test, result negative: Secondary | ICD-10-CM | POA: Insufficient documentation

## 2013-12-04 LAB — COMPREHENSIVE METABOLIC PANEL
ALK PHOS: 102 U/L (ref 39–117)
ALT: 18 U/L (ref 0–35)
AST: 22 U/L (ref 0–37)
Albumin: 3.6 g/dL (ref 3.5–5.2)
Anion gap: 15 (ref 5–15)
BUN: 8 mg/dL (ref 6–23)
CO2: 24 mEq/L (ref 19–32)
Calcium: 8.9 mg/dL (ref 8.4–10.5)
Chloride: 99 mEq/L (ref 96–112)
Creatinine, Ser: 0.69 mg/dL (ref 0.50–1.10)
GFR calc Af Amer: >90 mL/min (ref >90)
GFR calc non Af Amer: >90 mL/min (ref >90)
GLUCOSE: 101 mg/dL — AB (ref 70–99)
POTASSIUM: 3.8 meq/L (ref 3.7–5.3)
SODIUM: 138 meq/L (ref 137–147)
TOTAL PROTEIN: 7.8 g/dL (ref 6.0–8.3)
Total Bilirubin: 0.2 mg/dL — ABNORMAL LOW (ref 0.3–1.2)

## 2013-12-04 LAB — CBC WITH DIFFERENTIAL/PLATELET
BASOS PCT: 0 % (ref 0–1)
Basophils Absolute: 0 10*3/uL (ref 0.0–0.1)
EOS PCT: 2 % (ref 0–5)
Eosinophils Absolute: 0.2 10*3/uL (ref 0.0–0.7)
HCT: 39.6 % (ref 36.0–46.0)
HEMOGLOBIN: 13.1 g/dL (ref 12.0–15.0)
Lymphocytes Relative: 22 % (ref 12–46)
Lymphs Abs: 2.2 10*3/uL (ref 0.7–4.0)
MCH: 30.3 pg (ref 26.0–34.0)
MCHC: 33.1 g/dL (ref 30.0–36.0)
MCV: 91.5 fL (ref 78.0–100.0)
MONOS PCT: 5 % (ref 3–12)
Monocytes Absolute: 0.5 10*3/uL (ref 0.1–1.0)
Neutro Abs: 6.9 10*3/uL (ref 1.7–7.7)
Neutrophils Relative %: 71 % (ref 43–77)
Platelets: 265 10*3/uL (ref 150–400)
RBC: 4.33 MIL/uL (ref 3.87–5.11)
RDW: 12.7 % (ref 11.5–15.5)
WBC: 9.9 10*3/uL (ref 4.0–10.5)

## 2013-12-04 LAB — URINALYSIS, ROUTINE W REFLEX MICROSCOPIC
Bilirubin Urine: NEGATIVE
Glucose, UA: NEGATIVE mg/dL
KETONES UR: NEGATIVE mg/dL
Leukocytes, UA: NEGATIVE
Nitrite: NEGATIVE
PROTEIN: NEGATIVE mg/dL
Specific Gravity, Urine: 1.02 (ref 1.005–1.030)
Urobilinogen, UA: 0.2 mg/dL (ref 0.0–1.0)
pH: 5.5 (ref 5.0–8.0)

## 2013-12-04 LAB — URINE MICROSCOPIC-ADD ON

## 2013-12-04 LAB — POC URINE PREG, ED: Preg Test, Ur: NEGATIVE

## 2013-12-04 MED ORDER — KETOROLAC TROMETHAMINE 30 MG/ML IJ SOLN
30.0000 mg | Freq: Once | INTRAMUSCULAR | Status: AC
  Administered 2013-12-04: 30 mg via INTRAVENOUS
  Filled 2013-12-04: qty 1

## 2013-12-04 MED ORDER — ONDANSETRON 4 MG PO TBDP: ORAL_TABLET | ORAL | Status: AC

## 2013-12-04 MED ORDER — SODIUM CHLORIDE 0.9 % IV BOLUS (SEPSIS)
1000.0000 mL | Freq: Once | INTRAVENOUS | Status: AC
  Administered 2013-12-04: 1000 mL via INTRAVENOUS

## 2013-12-04 MED ORDER — KETOROLAC TROMETHAMINE 10 MG PO TABS: 10.0000 mg | ORAL_TABLET | Freq: Four times a day (QID) | ORAL | Status: DC | PRN

## 2013-12-04 MED ORDER — OXYCODONE HCL 5 MG PO TABS: 5.0000 mg | ORAL_TABLET | ORAL | Status: DC | PRN

## 2013-12-04 MED ORDER — ONDANSETRON HCL 4 MG/2ML IJ SOLN
4.0000 mg | Freq: Once | INTRAMUSCULAR | Status: AC
  Administered 2013-12-04: 4 mg via INTRAVENOUS
  Filled 2013-12-04: qty 2

## 2013-12-04 MED ORDER — HYDROMORPHONE HCL PF 1 MG/ML IJ SOLN
1.0000 mg | Freq: Once | INTRAMUSCULAR | Status: AC
Start: 2013-12-04 — End: 2013-12-04
  Administered 2013-12-04: 1 mg via INTRAVENOUS
  Filled 2013-12-04: qty 1

## 2013-12-04 MED ORDER — TAMSULOSIN HCL 0.4 MG PO CAPS: 0.4000 mg | ORAL_CAPSULE | Freq: Every day | ORAL | Status: DC

## 2013-12-04 NOTE — ED Notes (Signed)
Patient transported to CT 

## 2013-12-04 NOTE — ED Provider Notes (Signed)
CSN: [id number]     Arrival date & time 12/04/13  0047 History   First MD Initiated Contact with Patient 12/04/13 0059     Chief Complaint  Patient presents with  . Flank Pain     (Consider location/radiation/quality/duration/timing/severity/associated sxs/prior Treatment) HPI Patient presents with acute onset right flank pain. Is associated with nausea and vomiting. Pain started roughly 4 hours prior to presentation to the emergency department. Patient was also having difficulty urinating. She denies any fevers or chills. No gross blood in the vomit. She denies any diarrhea or constipation. Denies any abdominal pain. The previously similar symptoms. Denies hematuria.  Past Medical History  Diagnosis Date  . Boil   . Hypertension   . Arthritis   . GERD (gastroesophageal reflux disease)   . Recurrent knee pain   . Obesity   . Gout   . Renal disorder   . Fibroid    Past Surgical History  Procedure Laterality Date  . Tubal ligation    . Cholecystectomy    . Cesarean section     History reviewed. No pertinent family history. History  Substance Use Topics  . Smoking status: Never Smoker   . Smokeless tobacco: Not on file  . Alcohol Use: No     Comment: "not lately"   OB History   Grav Para Term Preterm Abortions TAB SAB Ect Mult Living                 Review of Systems  Constitutional: Negative for fever and chills.  Respiratory: Negative for cough and shortness of breath.   Cardiovascular: Negative for chest pain.  Gastrointestinal: Positive for nausea and vomiting. Negative for abdominal pain, diarrhea and constipation.  Genitourinary: Positive for flank pain and difficulty urinating. Negative for dysuria and hematuria.  Musculoskeletal: Positive for back pain. Negative for myalgias, neck pain and neck stiffness.  Skin: Negative for rash and wound.  Neurological: Negative for dizziness, weakness, light-headedness, numbness and headaches.  All other systems reviewed and  are negative.     Allergies  Fish allergy; Shellfish allergy; Iodine; Norco; and Tylenol  Home Medications   Prior to Admission medications   Medication Sig Start Date End Date Taking? Authorizing Provider  lisinopril-hydrochlorothiazide (PRINZIDE,ZESTORETIC) 20-25 MG per tablet Take 1 tablet by mouth daily.   Yes Corky Sox, MD  sucralfate (CARAFATE) 1 GM/10ML suspension Take 10 mLs (1 g total) by mouth 4 (four) times daily -  with meals and at bedtime. 07/16/13  Yes Abigail Harris, PA-C   BP 135/74  Pulse 67  Temp(Src) 98.6 F (37 C) (Oral)  Resp 14  SpO2 98%  LMP 11/30/2013 Physical Exam  Nursing note and vitals reviewed. Constitutional: She is oriented to person, place, and time. She appears well-developed and well-nourished. No distress.  HENT:  Head: Normocephalic and atraumatic.  Mouth/Throat: Oropharynx is clear and moist.  Eyes: EOM are normal. Pupils are equal, round, and reactive to light.  Neck: Normal range of motion. Neck supple.  Cardiovascular: Normal rate and regular rhythm.   Pulmonary/Chest: Effort normal and breath sounds normal. No respiratory distress. She has no wheezes. She has no rales.  Abdominal: Soft. Bowel sounds are normal. She exhibits no distension and no mass. There is no tenderness. There is no rebound and no guarding.  Musculoskeletal: Normal range of motion. She exhibits tenderness (tenderness to light palpation over the right flank.). She exhibits no edema.  Neurological: She is alert and oriented to person, place, and time.  Skin: Skin is warm and dry. No rash noted. No erythema.  Psychiatric: She has a normal mood and affect. Her behavior is normal.    ED Course  Procedures (including critical care time) Labs Review Labs Reviewed  COMPREHENSIVE METABOLIC PANEL - Abnormal; Notable for the following:    Glucose, Bld 101 (*)    Total Bilirubin 0.2 (*)    All other components within normal limits  URINALYSIS, ROUTINE W REFLEX  MICROSCOPIC - Abnormal; Notable for the following:    Hgb urine dipstick LARGE (*)    All other components within normal limits  URINE MICROSCOPIC-ADD ON - Abnormal; Notable for the following:    Squamous Epithelial / LPF FEW (*)    Bacteria, UA FEW (*)    Casts HYALINE CASTS (*)    All other components within normal limits  CBC WITH DIFFERENTIAL  POC URINE PREG, ED    Imaging Review Ct Abdomen Pelvis Wo Contrast  12/04/2013   CLINICAL DATA:  Right flank pain with nausea and vomiting  EXAM: CT ABDOMEN AND PELVIS WITHOUT CONTRAST  TECHNIQUE: Multidetector CT imaging of the abdomen and pelvis was performed following the standard protocol without IV contrast.  COMPARISON:  None.  FINDINGS: Moderate sigmoid scoliotic curvature of the thoracolumbar spine. Liver is normal. Gallbladder is surgically absent. Spleen and pancreas are normal.  Adrenal glands are normal. Left kidney is normal. There is mild perinephric inflammation on the right. There is severe dilatation of the right renal pelvis and proximal 2/3 of the right ureter. At the junction of the middle and distal thirds of the right ureter there is 5 mm stone. Distal to this the ureter is decompressed.  Bladder and reproductive organs are normal. There is no adenopathy. There is no ascites.  Stomach is normal. Small bowel is normal. Appendix is normal. Large bowel is normal.  Visualized portions of the lung bases are clear.  IMPRESSION: Severe hydronephrosis on the right due to 5 mm stone at the right ureter.   Electronically Signed   By: Skipper Cliche M.D.   On: 12/04/2013 02:28     EKG Interpretation None      MDM   Final diagnoses:  None    Patient's symptoms haven't improved. Vital signs remained stable. She was instructed to followup with urology. She's been given strict return precautions.    Julianne Rice, MD 12/04/13 [phone]

## 2013-12-04 NOTE — Discharge Instructions (Signed)
Make appointment to followup with the urologist. Return immediately for fever, chills, persistent vomiting, uncontrolled pain or for any concerns.  Kidney Stones Kidney stones (urolithiasis) are deposits that form inside your kidneys. The intense pain is caused by the stone moving through the urinary tract. When the stone moves, the ureter goes into spasm around the stone. The stone is usually passed in the urine.  CAUSES   A disorder that makes certain neck glands produce too much parathyroid hormone (primary hyperparathyroidism).  A buildup of uric acid crystals, similar to gout in your joints.  Narrowing (stricture) of the ureter.  A kidney obstruction present at birth (congenital obstruction).  Previous surgery on the kidney or ureters.  Numerous kidney infections. SYMPTOMS   Feeling sick to your stomach (nauseous).  Throwing up (vomiting).  Blood in the urine (hematuria).  Pain that usually spreads (radiates) to the groin.  Frequency or urgency of urination. DIAGNOSIS   Taking a history and physical exam.  Blood or urine tests.  CT scan.  Occasionally, an examination of the inside of the urinary bladder (cystoscopy) is performed. TREATMENT   Observation.  Increasing your fluid intake.  Extracorporeal shock wave lithotripsy--This is a noninvasive procedure that uses shock waves to break up kidney stones.  Surgery may be needed if you have severe pain or persistent obstruction. There are various surgical procedures. Most of the procedures are performed with the use of small instruments. Only small incisions are needed to accommodate these instruments, so recovery time is minimized. The size, location, and chemical composition are all important variables that will determine the proper choice of action for you. Talk to your health care provider to better understand your situation so that you will minimize the risk of injury to yourself and your kidney.  HOME CARE  INSTRUCTIONS   Drink enough water and fluids to keep your urine clear or pale yellow. This will help you to pass the stone or stone fragments.  Strain all urine through the provided strainer. Keep all particulate matter and stones for your health care provider to see. The stone causing the pain may be as small as a grain of salt. It is very important to use the strainer each and every time you pass your urine. The collection of your stone will allow your health care provider to analyze it and verify that a stone has actually passed. The stone analysis will often identify what you can do to reduce the incidence of recurrences.  Only take over-the-counter or prescription medicines for pain, discomfort, or fever as directed by your health care provider.  Make a follow-up appointment with your health care provider as directed.  Get follow-up X-rays if required. The absence of pain does not always mean that the stone has passed. It may have only stopped moving. If the urine remains completely obstructed, it can cause loss of kidney function or even complete destruction of the kidney. It is your responsibility to make sure X-rays and follow-ups are completed. Ultrasounds of the kidney can show blockages and the status of the kidney. Ultrasounds are not associated with any radiation and can be performed easily in a matter of minutes. SEEK MEDICAL CARE IF:  You experience pain that is progressive and unresponsive to any pain medicine you have been prescribed. SEEK IMMEDIATE MEDICAL CARE IF:   Pain cannot be controlled with the prescribed medicine.  You have a fever or shaking chills.  The severity or intensity of pain increases over 18 hours and is  not relieved by pain medicine.  You develop a new onset of abdominal pain.  You feel faint or pass out.  You are unable to urinate. MAKE SURE YOU:   Understand these instructions.  Will watch your condition.  Will get help right away if you are  not doing well or get worse. Document Released: 04/15/2005 Document Revised: 12/16/2012 Document Reviewed: 09/16/2012 [facility] Patient Information 2015 [place], [place]. This information is not intended to replace advice given to you by your health care provider. Make sure you discuss any questions you have with your health care provider.

## 2013-12-04 NOTE — ED Notes (Signed)
Presents with right flank pain associated with nausea and vomiting-she states, "I keep feeling like I have to have a BM, but I don't go" last BM this AM and normal. Pt is tearful at this time. Flank pain is worse with movement.

## 2013-12-05 ENCOUNTER — Encounter (HOSPITAL_COMMUNITY): Payer: Self-pay | Admitting: Emergency Medicine

## 2013-12-05 ENCOUNTER — Emergency Department (HOSPITAL_COMMUNITY)
Admission: EM | Admit: 2013-12-05 | Discharge: 2013-12-05 | Disposition: A | Discharge status: Home / Self Care | Payer: Medicaid Other | Attending: Emergency Medicine | Admitting: Emergency Medicine

## 2013-12-05 DIAGNOSIS — E669 Obesity, unspecified: Secondary | ICD-10-CM | POA: Insufficient documentation

## 2013-12-05 DIAGNOSIS — Z8739 Personal history of other diseases of the musculoskeletal system and connective tissue: Secondary | ICD-10-CM | POA: Insufficient documentation

## 2013-12-05 DIAGNOSIS — N200 Calculus of kidney: Secondary | ICD-10-CM | POA: Insufficient documentation

## 2013-12-05 DIAGNOSIS — Z8542 Personal history of malignant neoplasm of other parts of uterus: Secondary | ICD-10-CM | POA: Insufficient documentation

## 2013-12-05 DIAGNOSIS — Z79899 Other long term (current) drug therapy: Secondary | ICD-10-CM | POA: Insufficient documentation

## 2013-12-05 DIAGNOSIS — I10 Essential (primary) hypertension: Secondary | ICD-10-CM | POA: Insufficient documentation

## 2013-12-05 DIAGNOSIS — K219 Gastro-esophageal reflux disease without esophagitis: Secondary | ICD-10-CM | POA: Insufficient documentation

## 2013-12-05 LAB — URINALYSIS, ROUTINE W REFLEX MICROSCOPIC
Bilirubin Urine: NEGATIVE
Glucose, UA: NEGATIVE mg/dL
Ketones, ur: NEGATIVE mg/dL
Leukocytes, UA: NEGATIVE
Nitrite: NEGATIVE
Protein, ur: NEGATIVE mg/dL
Specific Gravity, Urine: 1.012 (ref 1.005–1.030)
Urobilinogen, UA: 0.2 mg/dL (ref 0.0–1.0)
pH: 5.5 (ref 5.0–8.0)

## 2013-12-05 LAB — URINE MICROSCOPIC-ADD ON

## 2013-12-05 LAB — COMPREHENSIVE METABOLIC PANEL
ALT: 16 U/L (ref 0–35)
AST: 20 U/L (ref 0–37)
Albumin: 3.6 g/dL (ref 3.5–5.2)
Alkaline Phosphatase: 97 U/L (ref 39–117)
Anion gap: 11 (ref 5–15)
BUN: 10 mg/dL (ref 6–23)
CO2: 27 mEq/L (ref 19–32)
Calcium: 8.8 mg/dL (ref 8.4–10.5)
Chloride: 103 mEq/L (ref 96–112)
Creatinine, Ser: 0.75 mg/dL (ref 0.50–1.10)
GFR calc Af Amer: >90 mL/min (ref >90)
GFR calc non Af Amer: >90 mL/min (ref >90)
Glucose, Bld: 100 mg/dL — ABNORMAL HIGH (ref 70–99)
Potassium: 4 mEq/L (ref 3.7–5.3)
Sodium: 141 mEq/L (ref 137–147)
Total Bilirubin: 0.3 mg/dL (ref 0.3–1.2)
Total Protein: 7.6 g/dL (ref 6.0–8.3)

## 2013-12-05 LAB — CBC
HCT: 39.3 % (ref 36.0–46.0)
Hemoglobin: 12.9 g/dL (ref 12.0–15.0)
MCH: 30.3 pg (ref 26.0–34.0)
MCHC: 32.8 g/dL (ref 30.0–36.0)
MCV: 92.3 fL (ref 78.0–100.0)
Platelets: 245 10*3/uL (ref 150–400)
RBC: 4.26 MIL/uL (ref 3.87–5.11)
RDW: 12.7 % (ref 11.5–15.5)
WBC: 10.1 10*3/uL (ref 4.0–10.5)

## 2013-12-05 MED ORDER — ONDANSETRON HCL 4 MG/2ML IJ SOLN
INTRAMUSCULAR | Status: AC
  Filled 2013-12-05: qty 2

## 2013-12-05 MED ORDER — ONDANSETRON HCL 4 MG/2ML IJ SOLN
4.0000 mg | Freq: Once | INTRAMUSCULAR | Status: AC
  Administered 2013-12-05: 4 mg via INTRAVENOUS

## 2013-12-05 MED ORDER — PROMETHAZINE HCL 25 MG PO TABS: 25.0000 mg | ORAL_TABLET | Freq: Four times a day (QID) | ORAL | Status: AC | PRN

## 2013-12-05 MED ORDER — TAMSULOSIN HCL 0.4 MG PO CAPS: 0.4000 mg | ORAL_CAPSULE | Freq: Every day | ORAL | Status: AC

## 2013-12-05 MED ORDER — OXYCODONE HCL 5 MG PO TABS: 5.0000 mg | ORAL_TABLET | ORAL | Status: AC | PRN

## 2013-12-05 MED ORDER — HYDROMORPHONE HCL PF 1 MG/ML IJ SOLN
0.5000 mg | Freq: Once | INTRAMUSCULAR | Status: AC
  Administered 2013-12-05: 0.5 mg via INTRAVENOUS
  Filled 2013-12-05: qty 1

## 2013-12-05 MED ORDER — FENTANYL CITRATE 0.05 MG/ML IJ SOLN
INTRAMUSCULAR | Status: AC
  Administered 2013-12-05: 50 ug via INTRAVENOUS
  Filled 2013-12-05: qty 2

## 2013-12-05 MED ORDER — KETOROLAC TROMETHAMINE 30 MG/ML IJ SOLN
30.0000 mg | Freq: Once | INTRAMUSCULAR | Status: AC
  Administered 2013-12-05: 30 mg via INTRAVENOUS
  Filled 2013-12-05: qty 1

## 2013-12-05 MED ORDER — ONDANSETRON HCL 4 MG/2ML IJ SOLN
4.0000 mg | Freq: Once | INTRAMUSCULAR | Status: AC
  Administered 2013-12-05: 4 mg via INTRAVENOUS
  Filled 2013-12-05: qty 2

## 2013-12-05 MED ORDER — FENTANYL CITRATE 0.05 MG/ML IJ SOLN
50.0000 ug | Freq: Once | INTRAMUSCULAR | Status: AC
  Administered 2013-12-05: 50 ug via INTRAVENOUS

## 2013-12-05 MED ORDER — MORPHINE SULFATE 4 MG/ML IJ SOLN
4.0000 mg | Freq: Once | INTRAMUSCULAR | Status: AC
  Administered 2013-12-05: 4 mg via INTRAVENOUS
  Filled 2013-12-05: qty 1

## 2013-12-05 MED ORDER — KETOROLAC TROMETHAMINE 10 MG PO TABS
10.0000 mg | ORAL_TABLET | Freq: Four times a day (QID) | ORAL | Status: AC | PRN
Start: 2013-12-05 — End: ?

## 2013-12-05 MED ORDER — TAMSULOSIN HCL 0.4 MG PO CAPS
0.4000 mg | ORAL_CAPSULE | Freq: Once | ORAL | Status: AC
  Administered 2013-12-05: 0.4 mg via ORAL
  Filled 2013-12-05: qty 1

## 2013-12-05 NOTE — ED Notes (Signed)
Pt states she was d/c'd from [facility] Sat am and dx with kidney stones.  She states she was unable to pick up meds until Thurs when she gets money.  So her pain is untreated and she keeps vomiting d/t pain.

## 2013-12-05 NOTE — Discharge Instructions (Signed)
Kidney Stones  Kidney stones (urolithiasis) are deposits that form inside your kidneys. The intense pain is caused by the stone moving through the urinary tract. When the stone moves, the ureter goes into spasm around the stone. The stone is usually passed in the urine.   CAUSES   · A disorder that makes certain neck glands produce too much parathyroid hormone (primary hyperparathyroidism).  · A buildup of uric acid crystals, similar to gout in your joints.  · Narrowing (stricture) of the ureter.  · A kidney obstruction present at birth (congenital obstruction).  · Previous surgery on the kidney or ureters.  · Numerous kidney infections.  SYMPTOMS   · Feeling sick to your stomach (nauseous).  · Throwing up (vomiting).  · Blood in the urine (hematuria).  · Pain that usually spreads (radiates) to the groin.  · Frequency or urgency of urination.  DIAGNOSIS   · Taking a history and physical exam.  · Blood or urine tests.  · CT scan.  · Occasionally, an examination of the inside of the urinary bladder (cystoscopy) is performed.  TREATMENT   · Observation.  · Increasing your fluid intake.  · Extracorporeal shock wave lithotripsy--This is a noninvasive procedure that uses shock waves to break up kidney stones.  · Surgery may be needed if you have severe pain or persistent obstruction. There are various surgical procedures. Most of the procedures are performed with the use of small instruments. Only small incisions are needed to accommodate these instruments, so recovery time is minimized.  The size, location, and chemical composition are all important variables that will determine the proper choice of action for you. Talk to your health care provider to better understand your situation so that you will minimize the risk of injury to yourself and your kidney.   HOME CARE INSTRUCTIONS   · Drink enough water and fluids to keep your urine clear or pale yellow. This will help you to pass the stone or stone fragments.  · Strain  all urine through the provided strainer. Keep all particulate matter and stones for your health care provider to see. The stone causing the pain may be as small as a grain of salt. It is very important to use the strainer each and every time you pass your urine. The collection of your stone will allow your health care provider to analyze it and verify that a stone has actually passed. The stone analysis will often identify what you can do to reduce the incidence of recurrences.  · Only take over-the-counter or prescription medicines for pain, discomfort, or fever as directed by your health care provider.  · Make a follow-up appointment with your health care provider as directed.  · Get follow-up X-rays if required. The absence of pain does not always mean that the stone has passed. It may have only stopped moving. If the urine remains completely obstructed, it can cause loss of kidney function or even complete destruction of the kidney. It is your responsibility to make sure X-rays and follow-ups are completed. Ultrasounds of the kidney can show blockages and the status of the kidney. Ultrasounds are not associated with any radiation and can be performed easily in a matter of minutes.  SEEK MEDICAL CARE IF:  · You experience pain that is progressive and unresponsive to any pain medicine you have been prescribed.  SEEK IMMEDIATE MEDICAL CARE IF:   · Pain cannot be controlled with the prescribed medicine.  · You have a fever or   shaking chills.  · The severity or intensity of pain increases over 18 hours and is not relieved by pain medicine.  · You develop a new onset of abdominal pain.  · You feel faint or pass out.  · You are unable to urinate.  MAKE SURE YOU:   · Understand these instructions.  · Will watch your condition.  · Will get help right away if you are not doing well or get worse.  Document Released: 04/15/2005 Document Revised: 12/16/2012 Document Reviewed: 09/16/2012  ExitCare® Patient Information ©2015  [employer]. This information is not intended to replace advice given to you by your health care provider. Make sure you discuss any questions you have with your health care provider.    Dietary Guidelines to Help Prevent Kidney Stones  Your risk of kidney stones can be decreased by adjusting the foods you eat. The most important thing you can do is drink enough fluid. You should drink enough fluid to keep your urine clear or pale yellow. The following guidelines provide specific information for the type of kidney stone you have had.  GUIDELINES ACCORDING TO TYPE OF KIDNEY STONE  Calcium Oxalate Kidney Stones  · Reduce the amount of salt you eat. Foods that have a lot of salt cause your body to release excess calcium into your urine. The excess calcium can combine with a substance called oxalate to form kidney stones.  · Reduce the amount of animal protein you eat if the amount you eat is excessive. Animal protein causes your body to release excess calcium into your urine. Ask your dietitian how much protein from animal sources you should be eating.  · Avoid foods that are high in oxalates. If you take vitamins, they should have less than 500 mg of vitamin C. Your body turns vitamin C into oxalates. You do not need to avoid fruits and vegetables high in vitamin C.  Calcium Phosphate Kidney Stones  · Reduce the amount of salt you eat to help prevent the release of excess calcium into your urine.  · Reduce the amount of animal protein you eat if the amount you eat is excessive. Animal protein causes your body to release excess calcium into your urine. Ask your dietitian how much protein from animal sources you should be eating.  · Get enough calcium from food or take a calcium supplement (ask your dietitian for recommendations). Food sources of calcium that do not increase your risk of kidney stones include:  ¨ Broccoli.  ¨ Dairy products, such as cheese and yogurt.  ¨ Pudding.  Uric Acid Kidney Stones  · Do not  have more than 6 oz of animal protein per day.  FOOD SOURCES  Animal Protein Sources  · Meat (all types).  · Poultry.  · Eggs.  · Fish, seafood.  Foods High in Salt  · Salt seasonings.  · Soy sauce.  · Teriyaki sauce.  · Cured and processed meats.  · Salted crackers and snack foods.  · Fast food.  · Canned soups and most canned foods.  Foods High in Oxalates  · Grains:  ¨ Amaranth.  ¨ Barley.  ¨ Grits.  ¨ Wheat germ.  ¨ Bran.  ¨ Buckwheat flour.  ¨ All bran cereals.  ¨ Pretzels.  ¨ Whole wheat bread.  · Vegetables:  ¨ Beans (wax).  ¨ Beets and beet greens.  ¨ Collard greens.  ¨ Eggplant.  ¨ Escarole.  ¨ Leeks.  ¨ Okra.  ¨ Parsley.  ¨ Rutabagas.  ¨   Spinach.  ¨ Swiss chard.  ¨ Tomato paste.  ¨ Fried potatoes.  ¨ Sweet potatoes.  · Fruits:  ¨ Red currants.  ¨ Figs.  ¨ Kiwi.  ¨ Rhubarb.  · Meat and Other Protein Sources:  ¨ Beans (dried).  ¨ Soy burgers and other soybean products.  ¨ Miso.  ¨ Nuts (peanuts, almonds, pecans, cashews, hazelnuts).  ¨ Nut butters.  ¨ Sesame seeds and tahini (paste made of sesame seeds).  ¨ Poppy seeds.  · Beverages:  ¨ Chocolate drink mixes.  ¨ Soy milk.  ¨ Instant iced tea.  ¨ Juices made from high-oxalate fruits or vegetables.  · Other:  ¨ Carob.  ¨ Chocolate.  ¨ Fruitcake.  ¨ Marmalades.  Document Released: 08/10/2010 Document Revised: 04/20/2013 Document Reviewed: 03/12/2013  ExitCare® Patient Information ©2015 [employer]. This information is not intended to replace advice given to you by your health care provider. Make sure you discuss any questions you have with your health care provider.

## 2013-12-05 NOTE — ED Provider Notes (Signed)
CSN: [id number]     Arrival date & time 12/05/13  1141 History   First MD Initiated Contact with Patient 12/05/13 1306     Chief Complaint  Patient presents with  . Nephrolithiasis     (Consider location/radiation/quality/duration/timing/severity/associated sxs/prior Treatment) HPI  Patient to the ER with complaints of right sided kidney pain due to kidney stone that is 5 mm confirmed on CT scan on 12/03/2013. Hydronepphrosis present at that time. She admits that she has not gotten her Flomax, Oxycodone, Toradol, or Zofran filled yet because she is concerned about the price. She dropped them off at the pharmacy and did not ask about how much they would cost. She comes back in today because they pain is so severe she can not tolerate it and she started vomiting again today when the pain would reach a peak. She is also having urinary hesitency.  Past Medical History  Diagnosis Date  . Boil   . Hypertension   . Arthritis   . GERD (gastroesophageal reflux disease)   . Recurrent knee pain   . Obesity   . Gout   . Renal disorder   . Fibroid    Past Surgical History  Procedure Laterality Date  . Tubal ligation    . Cholecystectomy    . Cesarean section     No family history on file. History  Substance Use Topics  . Smoking status: Never Smoker   . Smokeless tobacco: Not on file  . Alcohol Use: No     Comment: "not lately"   OB History   Grav Para Term Preterm Abortions TAB SAB Ect Mult Living                 Review of Systems  Review of Systems  Gen: no weight loss, fevers, chills, night sweats  Eyes: no discharge or drainage, no occular pain or visual changes  Nose: no epistaxis or rhinorrhea  Mouth: no dental pain, no sore throat  Neck: no neck pain  Lungs:No wheezing or hemoptysis No coughing CV:  No palpitations, dependent edema or orthopnea. No chest pain Abd: no diarrhea. No nausea or vomiting, No abdominal pain  GU: no dysuria or gross hematuria + urinary  hesitancy and right CVA pain MSK:  No muscle weakness, No  pain Neuro: no headache, no focal neurologic deficits  Skin: no rash , no wounds Psyche: no complaints     Allergies  Fish allergy; Shellfish allergy; Iodine; Norco; and Tylenol  Home Medications   Prior to Admission medications   Medication Sig Start Date End Date Taking? Authorizing Provider  lisinopril-hydrochlorothiazide (PRINZIDE,ZESTORETIC) 20-25 MG per tablet Take 1 tablet by mouth daily.   Yes Corky Sox, MD  sucralfate (CARAFATE) 1 GM/10ML suspension Take 1 g by mouth 4 (four) times daily as needed (stomach).   Yes Historical Provider, MD  ketorolac (TORADOL) 10 MG tablet Take 1 tablet (10 mg total) by mouth every 6 (six) hours as needed. 12/04/13   Julianne Rice, MD  ondansetron (ZOFRAN ODT) 4 MG disintegrating tablet 4mg  ODT q4 hours prn nausea/vomit 12/04/13   Julianne Rice, MD  oxyCODONE (ROXICODONE) 5 MG immediate release tablet Take 1 tablet (5 mg total) by mouth every 4 (four) hours as needed for severe pain. 12/04/13   Julianne Rice, MD  tamsulosin (FLOMAX) 0.4 MG CAPS capsule Take 1 capsule (0.4 mg total) by mouth daily. 12/04/13   Julianne Rice, MD   BP 102/39  Pulse 72  Temp(Src) 97.7 F (36.5  C) (Oral)  Resp 20  Ht 5\' 1"  (1.549 m)  Wt 329 lb (149.233 kg)  BMI 62.20 kg/m2  SpO2 96%  LMP 11/30/2013 Physical Exam  Nursing note and vitals reviewed. Constitutional: She appears well-developed and well-nourished. No distress.  HENT:  Head: Normocephalic and atraumatic.  Eyes: Pupils are equal, round, and reactive to light.  Neck: Normal range of motion. Neck supple.  Cardiovascular: Normal rate and regular rhythm.   Pulmonary/Chest: Effort normal.  Abdominal: Soft. Bowel sounds are normal. She exhibits no distension and no fluid wave. There is tenderness. There is guarding (voluntary) and CVA tenderness. There is no rebound. No hernia.  Neurological: She is alert.  Skin: Skin is warm and dry.     ED Course  Procedures (including critical care time) Labs Review Labs Reviewed  COMPREHENSIVE METABOLIC PANEL - Abnormal; Notable for the following:    Glucose, Bld 100 (*)    All other components within normal limits  URINALYSIS, ROUTINE W REFLEX MICROSCOPIC - Abnormal; Notable for the following:    APPearance CLOUDY (*)    Hgb urine dipstick LARGE (*)    All other components within normal limits  URINE MICROSCOPIC-ADD ON - Abnormal; Notable for the following:    Squamous Epithelial / LPF FEW (*)    Bacteria, UA FEW (*)    All other components within normal limits  CBC    Imaging Review Ct Abdomen Pelvis Wo Contrast  12/04/2013   CLINICAL DATA:  Right flank pain with nausea and vomiting  EXAM: CT ABDOMEN AND PELVIS WITHOUT CONTRAST  TECHNIQUE: Multidetector CT imaging of the abdomen and pelvis was performed following the standard protocol without IV contrast.  COMPARISON:  None.  FINDINGS: Moderate sigmoid scoliotic curvature of the thoracolumbar spine. Liver is normal. Gallbladder is surgically absent. Spleen and pancreas are normal.  Adrenal glands are normal. Left kidney is normal. There is mild perinephric inflammation on the right. There is severe dilatation of the right renal pelvis and proximal 2/3 of the right ureter. At the junction of the middle and distal thirds of the right ureter there is 5 mm stone. Distal to this the ureter is decompressed.  Bladder and reproductive organs are normal. There is no adenopathy. There is no ascites.  Stomach is normal. Small bowel is normal. Appendix is normal. Large bowel is normal.  Visualized portions of the lung bases are clear.  IMPRESSION: Severe hydronephrosis on the right due to 5 mm stone at the right ureter.   Electronically Signed   By: Skipper Cliche M.D.   On: 12/04/2013 02:28     EKG Interpretation None      MDM   Final diagnoses:  Nephrolithiasis   Patient with Nephrolithiasis. To the ED for pain and vomiting. She  admits the pain is not as bad as when she came a few days ago but she is still hurting. She has not been taking any medication at home because she never got her medications filled. The stone was only 47mm, therefore probability of obstructed stone is very low. Labs obtained and her kidney function has not been affected. Her urine is normal aside from large amount of hemoglobin. She tolerated PO without any difficulties.   In room when I got re-evaluate patient before discharge she is pain free with a pulse of 84 and BP of 134/78. She is awake and alert. She is happy and requesting to go home.  I will rx her medications for lower quantities so that she  can afford them. Pt still needs to see urology in the next few days.  48 y.o.Selita Staiger Mcneish's evaluation in the Emergency Department is complete. It has been determined that no acute conditions requiring further emergency intervention are present at this time. The patient/guardian have been advised of the diagnosis and plan. We have discussed signs and symptoms that warrant return to the ED, such as changes or worsening in symptoms.  Vital signs are stable at discharge. Filed Vitals:   12/05/13 1400  BP: 102/39  Pulse: 72  Temp:   Resp:     Patient/guardian has voiced understanding and agreed to follow-up with the PCP or specialist.     Linus Mako, PA-C 12/05/13 1515

## 2013-12-09 NOTE — ED Provider Notes (Signed)
Medical screening examination/treatment/procedure(s) were performed by non-physician practitioner and as supervising physician I was immediately available for consultation/collaboration.   EKG Interpretation None       Virgel Manifold, MD 12/09/13 [phone]

## 2013-12-10 ENCOUNTER — Ambulatory Visit (INDEPENDENT_AMBULATORY_CARE_PROVIDER_SITE_OTHER): Payer: No Typology Code available for payment source | Admitting: Internal Medicine

## 2013-12-10 ENCOUNTER — Ambulatory Visit: Payer: No Typology Code available for payment source

## 2013-12-10 ENCOUNTER — Encounter: Payer: Self-pay | Admitting: Internal Medicine

## 2013-12-10 VITALS — BP 145/108 | HR 77 | Temp 98.4°F | Ht 61.0 in | Wt 328.2 lb

## 2013-12-10 DIAGNOSIS — I10 Essential (primary) hypertension: Secondary | ICD-10-CM

## 2013-12-10 DIAGNOSIS — G8929 Other chronic pain: Secondary | ICD-10-CM

## 2013-12-10 DIAGNOSIS — K219 Gastro-esophageal reflux disease without esophagitis: Secondary | ICD-10-CM

## 2013-12-10 DIAGNOSIS — M25561 Pain in right knee: Secondary | ICD-10-CM

## 2013-12-10 DIAGNOSIS — M25562 Pain in left knee: Secondary | ICD-10-CM

## 2013-12-10 DIAGNOSIS — M25569 Pain in unspecified knee: Secondary | ICD-10-CM

## 2013-12-10 DIAGNOSIS — M545 Low back pain, unspecified: Secondary | ICD-10-CM

## 2013-12-10 DIAGNOSIS — K589 Irritable bowel syndrome without diarrhea: Secondary | ICD-10-CM

## 2013-12-10 DIAGNOSIS — N200 Calculus of kidney: Secondary | ICD-10-CM

## 2013-12-10 LAB — URIC ACID: Uric Acid, Serum: 7.4 mg/dL — ABNORMAL HIGH (ref 2.4–7.0)

## 2013-12-10 LAB — BASIC METABOLIC PANEL WITH GFR
BUN: 10 mg/dL (ref 6–23)
CALCIUM: 9.6 mg/dL (ref 8.4–10.5)
CO2: 30 meq/L (ref 19–32)
Chloride: 98 mEq/L (ref 96–112)
Creat: 0.81 mg/dL (ref 0.50–1.10)
GFR, Est Non African American: 87 mL/min
GLUCOSE: 96 mg/dL (ref 70–99)
Potassium: 3.3 mEq/L — ABNORMAL LOW (ref 3.5–5.3)
Sodium: 141 mEq/L (ref 135–145)

## 2013-12-10 LAB — PHOSPHORUS: Phosphorus: 3.7 mg/dL (ref 2.3–4.6)

## 2013-12-10 MED ORDER — LISINOPRIL-HYDROCHLOROTHIAZIDE 20-25 MG PO TABS: 1.0000 | ORAL_TABLET | Freq: Every day | ORAL | Status: AC

## 2013-12-10 MED ORDER — PANTOPRAZOLE SODIUM 40 MG PO TBEC: 40.0000 mg | DELAYED_RELEASE_TABLET | Freq: Every day | ORAL | Status: AC

## 2013-12-10 MED ORDER — DICYCLOMINE HCL 20 MG PO TABS: 20.0000 mg | ORAL_TABLET | Freq: Three times a day (TID) | ORAL | Status: AC | PRN

## 2013-12-10 MED ORDER — MELOXICAM 15 MG PO TABS: 15.0000 mg | ORAL_TABLET | Freq: Every day | ORAL | Status: AC

## 2013-12-10 NOTE — Assessment & Plan Note (Addendum)
Assessment: Pt with diagnosis of IBS with predominant diarrhea type currently on diet modification and anti-spasmodic therapy as neeed who presents with no recent flare.    Plan: -Continue diet modification -Refill dicyclomine 20 mg TID PRN spasms -Consider colonoscopy (no records available to rule out IBD)

## 2013-12-10 NOTE — Assessment & Plan Note (Addendum)
Assessment: Pt with second episode of nephrolithiasis on 12/04/13 with severe right hydronephrosis due to 5 mm right ureter obstructing stone with normal renal function who presents with well-controlled pain. Etiology unclear, most likely calcium oxalate stone in setting of decreased fluid intake and cola/tea use.   Plan:  -Pt given strainer to collect stone -Continue tamsulosin 0.4 mg daily to help pass stone  -Continue oxycodone 5 mg IR Q 4 hr PRN pain -Continue odansetran 4 mg Q 4 hr PRN and promethazine 25 mg Q 6 hr PRN nausea and vomiting -Urgent referral to urology for further management  -Pt instructed to increase fluid intake and avoid cola and tea  -Obtain BMP (renal function), uric acid and phosphorus level. Consider intact PTH level(calcium level normal on 12/05/13).   ADDENDUM: Pt with elevated uric acid level of 7.4 but normal urine pH and no stone available for analysis (may still not have passed it). Will defer to urology for further management.

## 2013-12-10 NOTE — Assessment & Plan Note (Addendum)
Assessment: Pt with moderately well-controlled hypertension compliant with two-class (ACEi and diuretic) anti-hypertensive therapy who presents with blood pressure of 145/108 in setting of pain.   Plan:  -BP 145/108 not at goal <140/90 -Continue lisinopril-HCTZ 20-25 mg daily  -Consider adding CCB (amlodipine) or increasing lisinopril to 40 mg daily -Last CMP normal on 12/05/13, repeat BMP to assess renal function  ADDENDUM: Pt with K 3.3, potassium chloride 40 mEq x one dose sent to pharmacy and pt to have repeat lab work.

## 2013-12-10 NOTE — Patient Instructions (Signed)
-  Will refer you urology, keep taking flomax and drinking fluids. Please try to collect your stone with the strainer.  -I refilled your medications -Will check your lab work and call you with abnormal results -Pleasure meeting you!  Kidney Stones Kidney stones (urolithiasis) are solid masses that form inside your kidneys. The intense pain is caused by the stone moving through the kidney, ureter, bladder, and urethra (urinary tract). When the stone moves, the ureter starts to spasm around the stone. The stone is usually passed in your pee (urine).  HOME CARE  Drink enough fluids to keep your pee clear or pale yellow. This helps to get the stone out.  Strain all pee through the provided strainer. Do not pee without peeing through the strainer, not even once. If you pee the stone out, catch it in the strainer. The stone may be as small as a grain of salt. Take this to your doctor. This will help your doctor figure out what you can do to try to prevent more kidney stones.  Only take medicine as told by your doctor.  Follow up with your doctor as told.  Get follow-up X-rays as told by your doctor. GET HELP IF: You have pain that gets worse even if you have been taking pain medicine. GET HELP RIGHT AWAY IF:   Your pain does not get better with medicine.  You have a fever or shaking chills.  Your pain increases and gets worse over 18 hours.  You have new belly (abdominal) pain.  You feel faint or pass out.  You are unable to pee. MAKE SURE YOU:   Understand these instructions.  Will watch your condition.  Will get help right away if you are not doing well or get worse. Document Released: 10/02/2007 Document Revised: 12/16/2012 Document Reviewed: 09/16/2012 [facility] Patient Information 2015 [place], [place]. This information is not intended to replace advice given to you by your health care provider. Make sure you discuss any questions you have with your health care  provider.   General Instructions:   Please try to bring all your medicines next time. This will help [place] keep you safe from mistakes.   Progress Toward Treatment Goals:  Treatment Goal 02/26/2013  Blood pressure unchanged    Self Care Goals & Plans:  Self Care Goal 12/10/2013  Manage my medications take my medicines as prescribed; bring my medications to every visit; refill my medications on time  Monitor my health -  Eat healthy foods eat more vegetables; eat foods that are low in salt; eat baked foods instead of fried foods  Be physically active -    No flowsheet data found.   Care Management & Community Referrals:  Referral 02/26/2013  Referrals made for care management support none needed

## 2013-12-10 NOTE — Progress Notes (Signed)
INTERNAL MEDICINE TEACHING ATTENDING ADDENDUM - Mirca Yale, MD: I reviewed and discussed at the time of visit with the resident Dr. Rabbani, the patient's medical history, physical examination, diagnosis and results of pertinent tests and treatment and I agree with the patient's care as documented.  

## 2013-12-10 NOTE — Assessment & Plan Note (Addendum)
Assessment: Pt with bilateral knee pain with evidence of degeneration on imaging in 2010 most likely due to osteoarthritis who presents with well-controlled pain on NSAID therapy without recent fall, trauma, or injury.   Plan: -Refill meloxicam 7.5 mg daily for concomitant low back pain as well -Encourage weight loss (BMI 62.05) and consider bariatric surgery -Consider referring back to sports medicine for intraarticular steroid injection if pt agreeable (declined injections in 2011/2012 due fear of needles)

## 2013-12-10 NOTE — Progress Notes (Signed)
Patient ID: Carrie Harvey, female   DOB: [birth year], 48 y.o.   MRN: [id number]    Subjective:   Patient ID: Carrie Harvey female   DOB: 12-25-1965 48 y.o.   MRN: [id number]  HPI: [name] is a 48 y.o. pleasant woman with past medical history of hypertension, osteoathritis, questionable RA, IBS, DUB on depo, and GERD who presents with ED follow-up of recent kidney stone.   She reports history of nephrolithiasis approximately 10 years ago. She was doing well until 12/04/13 (per records had right flank pain in August 2012) when she began having severe right flank pain with nausea, vomiting, and urinary frequency. She was evaluated in the ED and CT scan revealed severe hydronephrosis due to obstructing right 5 mm stone in the right ureter. She was given flomax, anti-nausea medication (zofran, phenergan), and pain medication (oxycodone). She does not believe that her stone has yet passed but her right flank pain has improved from unbearable pain to 8/10 and her nausea and vomiting has resolved. She continues to have urinary frequency without hematuria. She reports being on her menstrual period a few days before her symptoms began. Her UA in the ED revealed hematuria. She is compliant with taking flomax daily. She was told to follow-up with urology but has not done so yet. She denies history of gout (however gout is listed in her pmh). She has diagnosis of IBS but not IBD. She reports drinking tea, cola, and kool-aid before developing the stone. She has now tried to increase her fluid intake with water.   She is compliant with taking lisinopril-HCTZ 20-25 mg daily for hypertension. She reports mild lightheadedness but denies headache, blurry vision, chest pain, palpitations, or LE edema.   She reports her acid reflux disease is moderately well controlled and takes OTC medication (prolosec?) for it.   Her IBS is well-controlled with diet and dicyclomine as needed for abdominal spasms. She has episodes  of diarrhea without much constipation. She has never had a screening colonoscopy.   She takes mobic 7.5 mg daily for chronic low back pain and bilateral knee osteoarthritis. Her pain is well-controlled. She denies recent fall, injury, or trauma. She was seen by Sports medicine in 2011/2012 and declined steroid injections at that time.    Past Medical History  Diagnosis Date  . Boil   . Hypertension   . Arthritis   . GERD (gastroesophageal reflux disease)   . Recurrent knee pain   . Obesity   . Gout   . Renal disorder   . Fibroid    Current Outpatient Prescriptions  Medication Sig Dispense Refill  . dicyclomine (BENTYL) 20 MG tablet Take 1 tablet (20 mg total) by mouth 3 (three) times daily as needed for spasms.  30 tablet  4  . ketorolac (TORADOL) 10 MG tablet Take 1 tablet (10 mg total) by mouth every 6 (six) hours as needed.  6 tablet  0  . lisinopril-hydrochlorothiazide (PRINZIDE,ZESTORETIC) 20-25 MG per tablet Take 1 tablet by mouth daily.  30 tablet  5  . meloxicam (MOBIC) 15 MG tablet Take 1 tablet (15 mg total) by mouth daily.  30 tablet  4  . ondansetron (ZOFRAN ODT) 4 MG disintegrating tablet 4mg  ODT q4 hours prn nausea/vomit  8 tablet  0  . oxyCODONE (ROXICODONE) 5 MG immediate release tablet Take 1 tablet (5 mg total) by mouth every 4 (four) hours as needed for severe pain.  10 tablet  0  . pantoprazole (PROTONIX) 40  MG tablet Take 1 tablet (40 mg total) by mouth daily.  30 tablet  4  . promethazine (PHENERGAN) 25 MG tablet Take 1 tablet (25 mg total) by mouth every 6 (six) hours as needed for nausea or vomiting.  10 tablet  0  . sucralfate (CARAFATE) 1 GM/10ML suspension Take 1 g by mouth 4 (four) times daily as needed (stomach).      . tamsulosin (FLOMAX) 0.4 MG CAPS capsule Take 1 capsule (0.4 mg total) by mouth daily.  10 capsule  0   Current Facility-Administered Medications  Medication Dose Route Frequency Provider Last Rate Last Dose  . medroxyPROGESTERone  (DEPO-PROVERA) injection 150 mg  150 mg Intramuscular Once Woodroe Mode, MD       No family history on file. History   Social History  . Marital Status: Married    Spouse Name: N/A    Number of Children: N/A  . Years of Education: N/A   Social History Main Topics  . Smoking status: Never Smoker   . Smokeless tobacco: None  . Alcohol Use: No     Comment: "not lately"  . Drug Use: No  . Sexual Activity: Not Currently    Birth Control/ Protection: Surgical   Other Topics Concern  . None   Social History Narrative   Financial assistance approved for 100% discount at [facility] and has [facility] card   [employer]  March 30, 2010 3:31 PM   Review of Systems: Review of Systems  Constitutional: Negative for fever and chills.  HENT: Negative for congestion and sore throat.   Eyes: Negative for blurred vision.  Respiratory: Negative for cough, shortness of breath and wheezing.   Cardiovascular: Negative for chest pain, palpitations and leg swelling.  Gastrointestinal: Positive for heartburn. Negative for nausea, vomiting, abdominal pain, diarrhea and constipation.  Genitourinary: Positive for frequency and flank pain (right sided since ). Negative for hematuria.  Musculoskeletal: Positive for back pain (chronic low back ) and joint pain (knees (R>L)). Negative for falls.  Skin: Negative for rash.  Neurological: Positive for dizziness. Negative for headaches.  Endo/Heme/Allergies: Positive for environmental allergies.    Objective:  Physical Exam: Filed Vitals:   12/10/13 0935  BP: 145/108  Pulse: 77  Temp: 98.4 F (36.9 C)  TempSrc: Oral  Height: 5\' 1"  (1.549 m)  Weight: 328 lb 3.2 oz (148.871 kg)  SpO2: 99%    Physical Exam  Constitutional: She is oriented to person, place, and time. She appears well-developed and well-nourished. No distress.  HENT:  Head: Normocephalic and atraumatic.  Right Ear: External ear normal.  Left Ear: External ear normal.  Nose: Nose  normal.  Mouth/Throat: Oropharynx is clear and moist. No oropharyngeal exudate.  No ulcers  Eyes: EOM are normal. No scleral icterus.  Neck: Normal range of motion. Neck supple.  Cardiovascular: Normal rate and regular rhythm.   Pulmonary/Chest: Effort normal. No respiratory distress. She has no wheezes. She has no rales.  Decreased breath sounds  Abdominal: Soft. Bowel sounds are normal. She exhibits no distension. There is no tenderness. There is no rebound and no guarding.  Genitourinary:  Right CVA tenderness  Musculoskeletal: Normal range of motion. She exhibits no edema and no tenderness.  Neurological: She is alert and oriented to person, place, and time.  Skin: Skin is warm and dry. No rash noted. She is not diaphoretic. No erythema. No pallor.  Psychiatric: She has a normal mood and affect. Her behavior is normal. Judgment and thought  content normal.    Assessment & Plan:   Please see problem list for problem-based assessment and plan

## 2013-12-10 NOTE — Assessment & Plan Note (Signed)
Assessment: Pt with moderately well-controlled acid reflux disease with questionable compliance with OTC therapy who presents with no alarm symptoms.   Plan:  -Prescribe protonix 40 mg daily and discontinue OTC medications (PPI or H2 blocker)  -Continue sucralfate 1 g x4 daily PRN -Monitor for alarm symptoms warranting EGD

## 2013-12-15 ENCOUNTER — Ambulatory Visit: Payer: No Typology Code available for payment source

## 2013-12-15 MED ORDER — POTASSIUM CHLORIDE CRYS ER 20 MEQ PO TBCR: 40.0000 meq | EXTENDED_RELEASE_TABLET | Freq: Once | ORAL | Status: AC

## 2013-12-15 NOTE — Addendum Note (Signed)
Addended byJuluis Mire on: 12/15/2013 07:10 PM   Modules accepted: Orders

## 2013-12-16 NOTE — Addendum Note (Signed)
Addended byJuluis Mire on: 12/16/2013 11:00 AM   Modules accepted: Orders

## 2013-12-20 ENCOUNTER — Ambulatory Visit: Payer: No Typology Code available for payment source

## 2013-12-21 ENCOUNTER — Other Ambulatory Visit: Payer: No Typology Code available for payment source

## 2013-12-22 ENCOUNTER — Ambulatory Visit: Payer: No Typology Code available for payment source

## 2014-02-06 NOTE — Addendum Note (Signed)
Addended byJuluis Mire on: 02/06/2014 02:16 PM   Modules accepted: Orders

## 2014-02-08 NOTE — Addendum Note (Signed)
Addended by: Hulan Fray on: 02/08/2014 09:53 PM   Modules accepted: Orders

## 2015-07-17 IMAGING — US US PELVIS COMPLETE
1 series · 14 of 25 positions shown · non-contrast
Comparison: 02/02/2013

CLINICAL DATA: Left adnexal tenderness.  Question torsion.

EXAM:
TRANSABDOMINAL AND TRANSVAGINAL ULTRASOUND OF PELVIS
DOPPLER ULTRASOUND OF OVARIES
TECHNIQUE: Both transabdominal and transvaginal ultrasound examinations of the
pelvis were performed. Transabdominal technique was performed for
global imaging of the pelvis including uterus, ovaries, adnexal
regions, and pelvic cul-de-sac.
It was necessary to proceed with endovaginal exam following the
transabdominal exam to visualize the ovaries. Color and duplex
Doppler ultrasound was utilized to evaluate blood flow to the
ovaries.

[Series 1: us pelvis complete · 0.32mm/px · 14 of 44 slices shown]
[im 1/44]
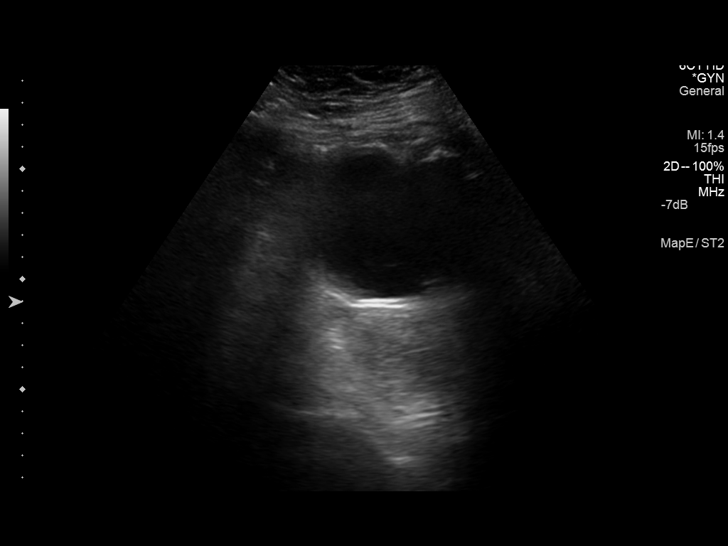
[im 4/44]
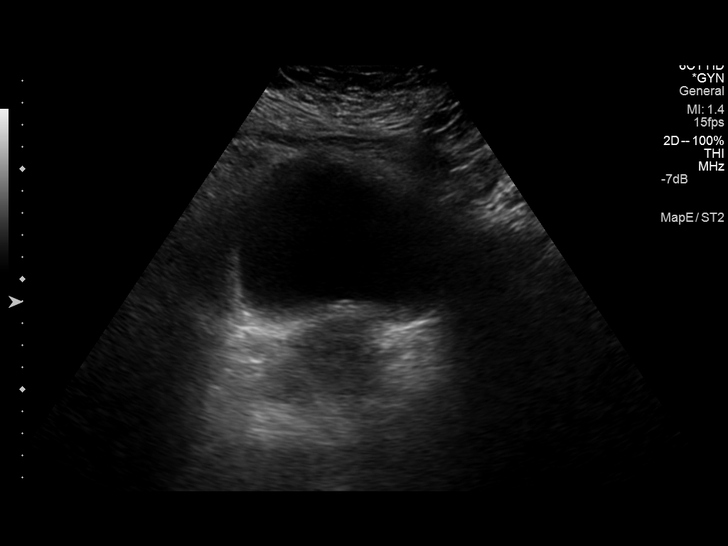
[im 8/44]
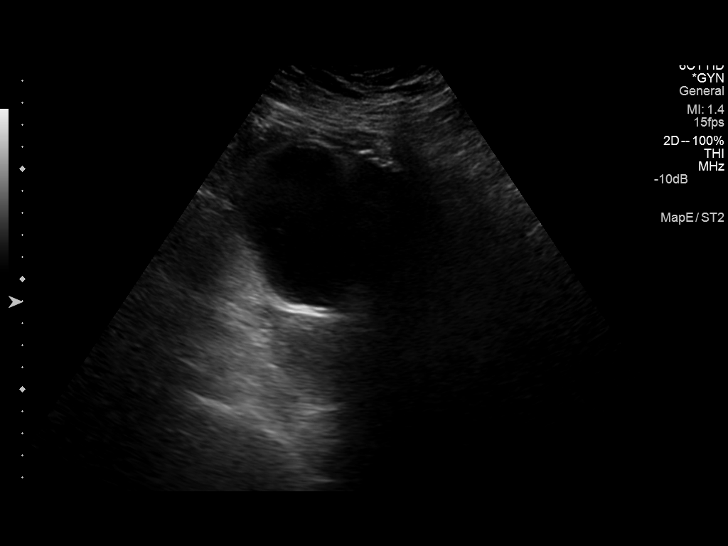
[im 11/44]
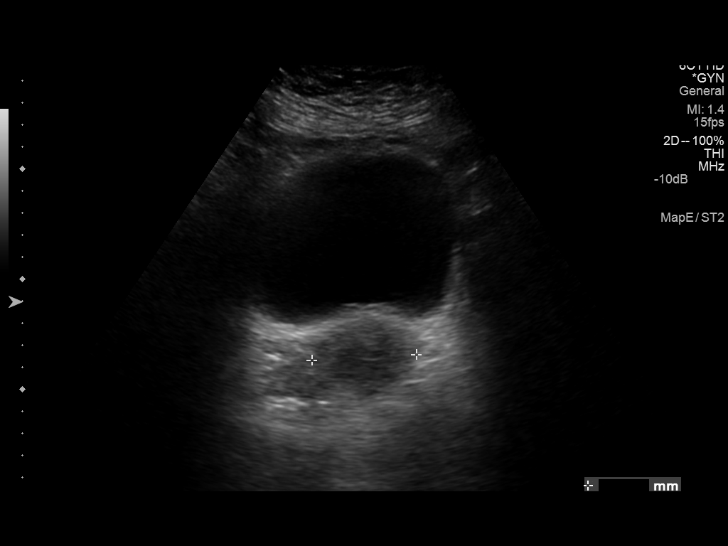
[im 15/44]
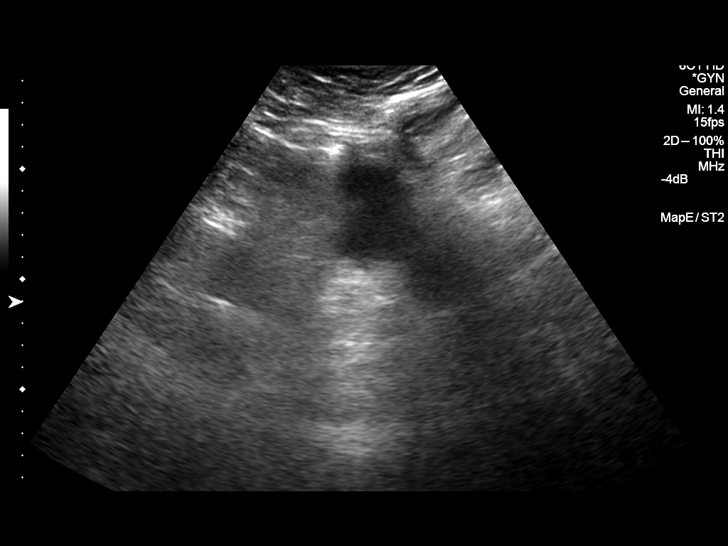
[im 17/44]
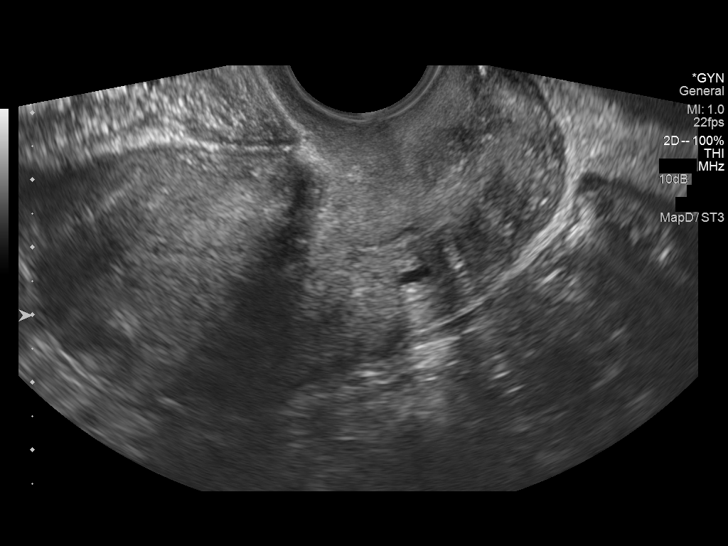
[im 20/44]
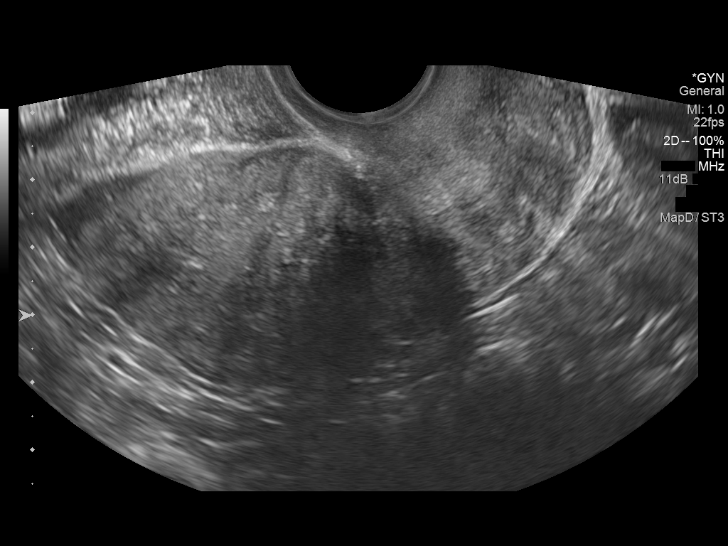
[im 24/44]
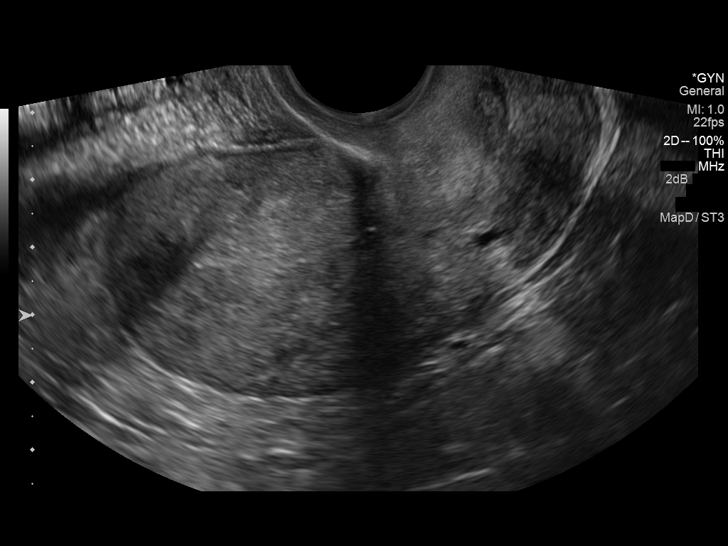
[im 27/44]
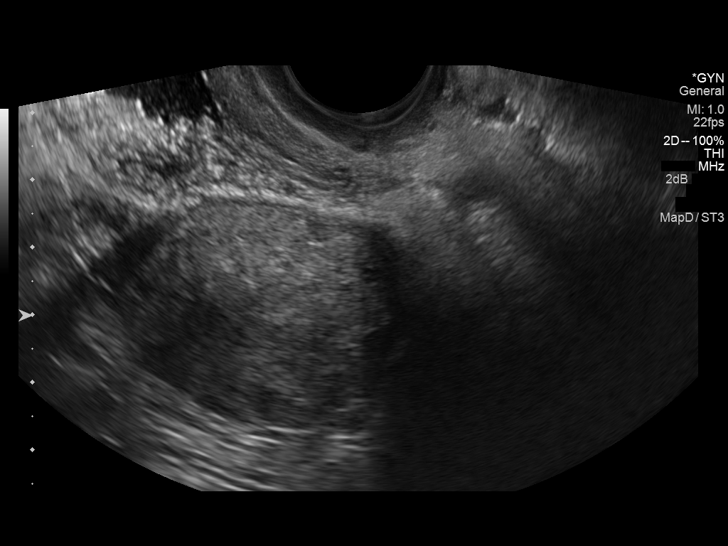
[im 29/44]
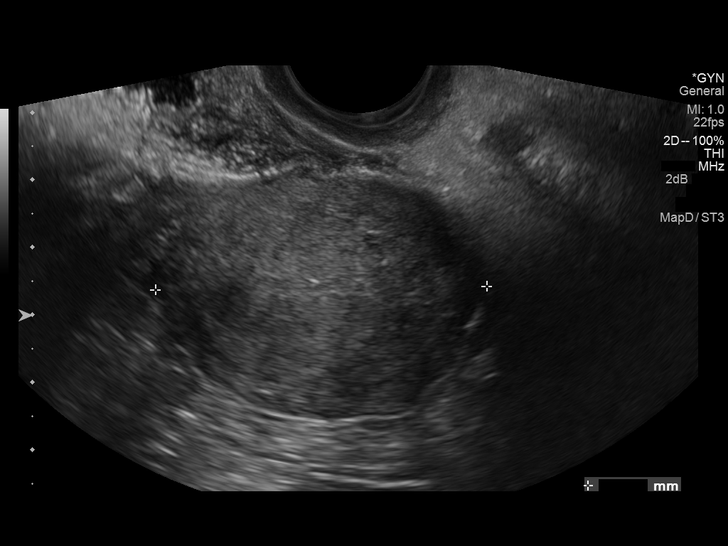
[im 33/44]
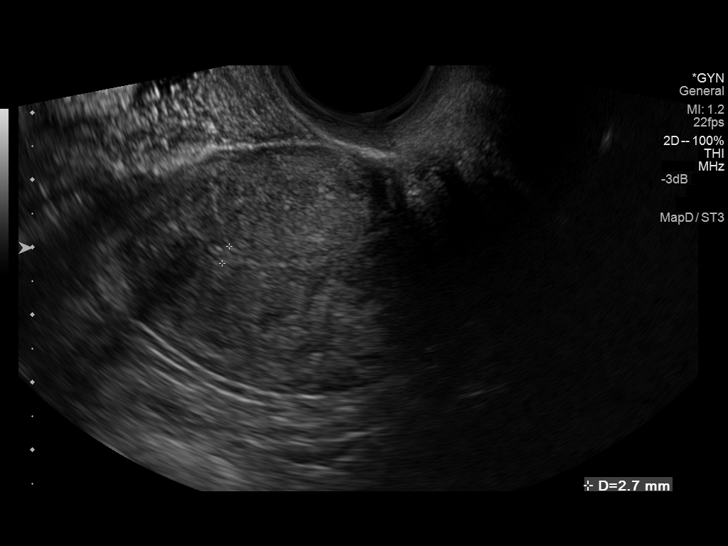
[im 36/44]
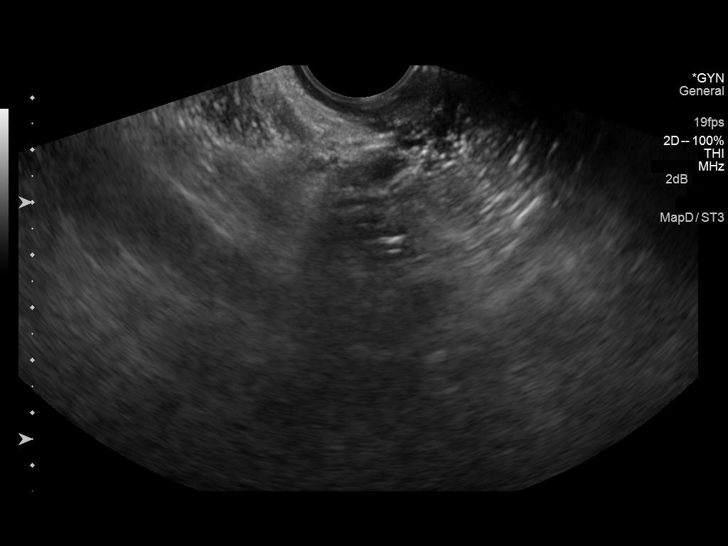
[im 40/44]
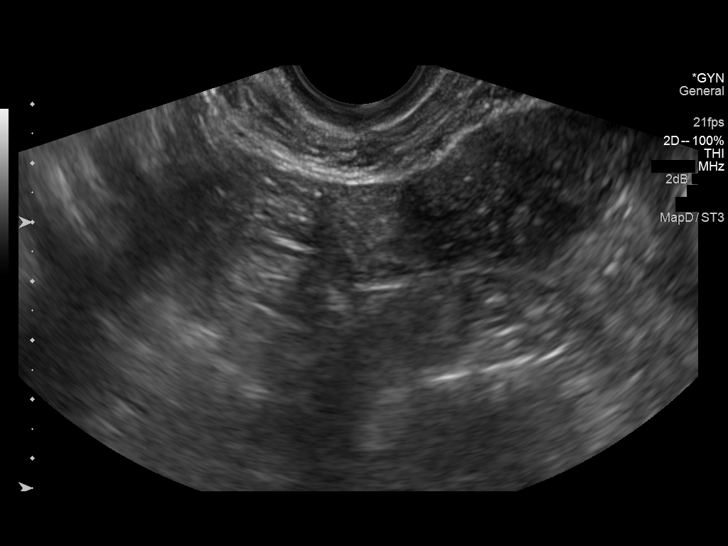
[im 44/44]
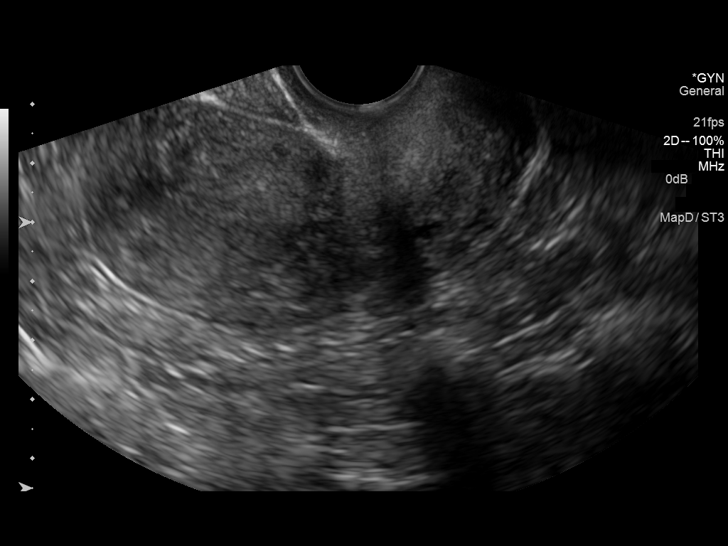

[14 of 25 positions shown; findings below may reference images not displayed]

FINDINGS: Uterus

Measurements: 7.7 x 3.6 x 4.9 cm. 11 mm hypoechoic area within the
posterior myometrium compatible with small fibroid.

Endometrium

Thickness: 3 mm in thickness..  No focal abnormality visualized.

Right ovary

Measurements: Not visualized. No adnexal masses.

Left ovary

Measurements: Not visualized. No adnexal masses seen.

Other findings

No free fluid.

The study was complicated by body habitus.
IMPRESSION: Unable to visualize the ovaries bilaterally. No adnexal masses seen.

Small posterior intramural fibroid.
